# Patient Record
Sex: Female | Born: 1935 | Race: White | Hispanic: No | Marital: Married
Health system: Southern US, Community
[De-identification: ages and names within clinical notes are randomized; demographics above are authoritative.]

---

## 2005-01-02 ENCOUNTER — Ambulatory Visit: Payer: Self-pay | Admitting: Otolaryngology

## 2005-04-14 ENCOUNTER — Ambulatory Visit: Payer: Self-pay | Admitting: Internal Medicine

## 2005-09-29 ENCOUNTER — Ambulatory Visit: Payer: Self-pay | Admitting: General Practice

## 2005-11-12 ENCOUNTER — Ambulatory Visit: Payer: Self-pay | Admitting: Unknown Physician Specialty

## 2006-04-16 ENCOUNTER — Ambulatory Visit: Payer: Self-pay | Admitting: Internal Medicine

## 2007-04-21 ENCOUNTER — Ambulatory Visit: Payer: Self-pay | Admitting: Internal Medicine

## 2008-07-19 ENCOUNTER — Ambulatory Visit: Payer: Self-pay | Admitting: Family Medicine

## 2008-07-19 ENCOUNTER — Ambulatory Visit: Payer: Self-pay | Admitting: Internal Medicine

## 2008-08-21 ENCOUNTER — Ambulatory Visit: Payer: Self-pay

## 2008-08-30 ENCOUNTER — Ambulatory Visit: Payer: Self-pay

## 2008-10-26 ENCOUNTER — Emergency Department: Payer: Self-pay | Admitting: Emergency Medicine

## 2009-06-27 ENCOUNTER — Ambulatory Visit: Payer: Self-pay | Admitting: Otolaryngology

## 2009-10-19 ENCOUNTER — Ambulatory Visit: Payer: Self-pay | Admitting: Internal Medicine

## 2010-10-02 ENCOUNTER — Ambulatory Visit: Payer: Medicare Other | Admitting: Occupational Therapy

## 2010-10-02 ENCOUNTER — Ambulatory Visit: Payer: Medicare Other | Attending: Internal Medicine | Admitting: Physical Therapy

## 2010-10-02 DIAGNOSIS — M2420 Disorder of ligament, unspecified site: Secondary | ICD-10-CM | POA: Insufficient documentation

## 2010-10-02 DIAGNOSIS — R269 Unspecified abnormalities of gait and mobility: Secondary | ICD-10-CM | POA: Insufficient documentation

## 2010-10-02 DIAGNOSIS — Z5189 Encounter for other specified aftercare: Secondary | ICD-10-CM | POA: Insufficient documentation

## 2010-10-02 DIAGNOSIS — M6281 Muscle weakness (generalized): Secondary | ICD-10-CM | POA: Insufficient documentation

## 2010-10-02 DIAGNOSIS — I69998 Other sequelae following unspecified cerebrovascular disease: Secondary | ICD-10-CM | POA: Insufficient documentation

## 2010-10-02 DIAGNOSIS — M2560 Stiffness of unspecified joint, not elsewhere classified: Secondary | ICD-10-CM | POA: Insufficient documentation

## 2010-10-02 DIAGNOSIS — M629 Disorder of muscle, unspecified: Secondary | ICD-10-CM | POA: Insufficient documentation

## 2010-10-04 ENCOUNTER — Ambulatory Visit: Payer: Medicare Other | Admitting: Physical Therapy

## 2010-10-09 ENCOUNTER — Ambulatory Visit: Payer: Medicare Other | Admitting: Physical Therapy

## 2010-10-10 ENCOUNTER — Ambulatory Visit: Payer: Medicare Other | Admitting: Physical Therapy

## 2010-10-11 ENCOUNTER — Ambulatory Visit: Payer: Medicare Other | Admitting: Physical Therapy

## 2010-10-14 ENCOUNTER — Ambulatory Visit: Payer: Medicare Other | Admitting: Physical Therapy

## 2010-10-14 ENCOUNTER — Ambulatory Visit: Payer: Medicare Other | Admitting: Occupational Therapy

## 2010-10-16 ENCOUNTER — Ambulatory Visit: Payer: Medicare Other | Admitting: Physical Therapy

## 2010-10-16 ENCOUNTER — Ambulatory Visit: Payer: Medicare Other | Admitting: Occupational Therapy

## 2010-10-18 ENCOUNTER — Ambulatory Visit: Payer: Medicare Other | Admitting: Physical Therapy

## 2010-10-18 ENCOUNTER — Ambulatory Visit: Payer: Medicare Other | Admitting: Occupational Therapy

## 2010-10-21 ENCOUNTER — Ambulatory Visit: Payer: Medicare Other | Admitting: Physical Therapy

## 2010-10-21 ENCOUNTER — Ambulatory Visit: Payer: Medicare Other | Admitting: Occupational Therapy

## 2010-10-22 ENCOUNTER — Ambulatory Visit: Payer: Medicare Other | Admitting: Occupational Therapy

## 2010-10-22 ENCOUNTER — Ambulatory Visit: Payer: Medicare Other | Admitting: Physical Therapy

## 2010-10-24 ENCOUNTER — Ambulatory Visit: Payer: Medicare Other | Admitting: Physical Therapy

## 2010-10-24 ENCOUNTER — Ambulatory Visit: Payer: Medicare Other | Admitting: Occupational Therapy

## 2010-10-29 ENCOUNTER — Ambulatory Visit: Payer: Medicare Other | Attending: Internal Medicine | Admitting: Physical Therapy

## 2010-10-29 DIAGNOSIS — R269 Unspecified abnormalities of gait and mobility: Secondary | ICD-10-CM | POA: Insufficient documentation

## 2010-10-29 DIAGNOSIS — M629 Disorder of muscle, unspecified: Secondary | ICD-10-CM | POA: Insufficient documentation

## 2010-10-29 DIAGNOSIS — I69998 Other sequelae following unspecified cerebrovascular disease: Secondary | ICD-10-CM | POA: Insufficient documentation

## 2010-10-29 DIAGNOSIS — M2560 Stiffness of unspecified joint, not elsewhere classified: Secondary | ICD-10-CM | POA: Insufficient documentation

## 2010-10-29 DIAGNOSIS — M2420 Disorder of ligament, unspecified site: Secondary | ICD-10-CM | POA: Insufficient documentation

## 2010-10-29 DIAGNOSIS — M6281 Muscle weakness (generalized): Secondary | ICD-10-CM | POA: Insufficient documentation

## 2010-10-29 DIAGNOSIS — Z5189 Encounter for other specified aftercare: Secondary | ICD-10-CM | POA: Insufficient documentation

## 2010-10-30 ENCOUNTER — Ambulatory Visit: Payer: Medicare Other | Admitting: Occupational Therapy

## 2010-10-30 ENCOUNTER — Ambulatory Visit: Payer: Medicare Other | Admitting: Physical Therapy

## 2010-11-01 ENCOUNTER — Ambulatory Visit: Payer: Medicare Other | Admitting: Physical Therapy

## 2010-11-01 ENCOUNTER — Ambulatory Visit: Payer: Medicare Other | Admitting: Occupational Therapy

## 2010-11-04 ENCOUNTER — Ambulatory Visit: Payer: Medicare Other | Admitting: Occupational Therapy

## 2010-11-04 ENCOUNTER — Ambulatory Visit: Payer: Medicare Other | Admitting: Physical Therapy

## 2010-11-07 ENCOUNTER — Ambulatory Visit: Payer: Medicare Other | Admitting: Physical Therapy

## 2010-11-07 ENCOUNTER — Ambulatory Visit: Payer: Medicare Other | Admitting: Occupational Therapy

## 2010-11-11 ENCOUNTER — Ambulatory Visit: Payer: Medicare Other | Admitting: Occupational Therapy

## 2010-11-11 ENCOUNTER — Ambulatory Visit: Payer: Medicare Other | Admitting: Physical Therapy

## 2010-11-14 ENCOUNTER — Ambulatory Visit: Payer: Medicare Other | Admitting: Occupational Therapy

## 2010-11-14 ENCOUNTER — Ambulatory Visit: Payer: Medicare Other | Admitting: Physical Therapy

## 2010-11-18 ENCOUNTER — Ambulatory Visit: Payer: Medicare Other | Admitting: Physical Therapy

## 2010-11-18 ENCOUNTER — Ambulatory Visit: Payer: Medicare Other | Admitting: Occupational Therapy

## 2010-11-21 ENCOUNTER — Ambulatory Visit: Payer: Medicare Other | Admitting: Occupational Therapy

## 2010-11-21 ENCOUNTER — Ambulatory Visit: Payer: Medicare Other | Admitting: Physical Therapy

## 2010-11-25 ENCOUNTER — Ambulatory Visit: Payer: Medicare Other | Attending: Internal Medicine | Admitting: Physical Therapy

## 2010-11-25 ENCOUNTER — Ambulatory Visit: Payer: Medicare Other | Admitting: Occupational Therapy

## 2010-11-25 DIAGNOSIS — I69998 Other sequelae following unspecified cerebrovascular disease: Secondary | ICD-10-CM | POA: Insufficient documentation

## 2010-11-25 DIAGNOSIS — M2420 Disorder of ligament, unspecified site: Secondary | ICD-10-CM | POA: Insufficient documentation

## 2010-11-25 DIAGNOSIS — M629 Disorder of muscle, unspecified: Secondary | ICD-10-CM | POA: Insufficient documentation

## 2010-11-25 DIAGNOSIS — M6281 Muscle weakness (generalized): Secondary | ICD-10-CM | POA: Insufficient documentation

## 2010-11-25 DIAGNOSIS — R269 Unspecified abnormalities of gait and mobility: Secondary | ICD-10-CM | POA: Insufficient documentation

## 2010-11-25 DIAGNOSIS — M2560 Stiffness of unspecified joint, not elsewhere classified: Secondary | ICD-10-CM | POA: Insufficient documentation

## 2010-11-25 DIAGNOSIS — Z5189 Encounter for other specified aftercare: Secondary | ICD-10-CM | POA: Insufficient documentation

## 2010-11-28 ENCOUNTER — Ambulatory Visit: Payer: Medicare Other | Admitting: Occupational Therapy

## 2010-11-28 ENCOUNTER — Ambulatory Visit: Payer: Medicare Other | Admitting: Physical Therapy

## 2010-12-03 ENCOUNTER — Ambulatory Visit: Payer: Medicare Other | Admitting: Occupational Therapy

## 2010-12-03 ENCOUNTER — Ambulatory Visit: Payer: Medicare Other | Admitting: Physical Therapy

## 2010-12-06 ENCOUNTER — Ambulatory Visit: Payer: Medicare Other | Admitting: Physical Therapy

## 2010-12-10 ENCOUNTER — Ambulatory Visit: Payer: Medicare Other | Admitting: Physical Therapy

## 2010-12-10 ENCOUNTER — Ambulatory Visit: Payer: Medicare Other | Admitting: Occupational Therapy

## 2010-12-12 ENCOUNTER — Ambulatory Visit: Payer: Medicare Other | Admitting: Physical Therapy

## 2010-12-12 ENCOUNTER — Ambulatory Visit: Payer: Medicare Other | Admitting: Occupational Therapy

## 2010-12-17 ENCOUNTER — Ambulatory Visit: Payer: Medicare Other | Admitting: Physical Therapy

## 2010-12-17 ENCOUNTER — Ambulatory Visit: Payer: Medicare Other | Admitting: Occupational Therapy

## 2010-12-19 ENCOUNTER — Ambulatory Visit: Payer: Medicare Other | Admitting: Physical Therapy

## 2010-12-19 ENCOUNTER — Ambulatory Visit: Payer: Medicare Other | Admitting: Occupational Therapy

## 2010-12-24 ENCOUNTER — Ambulatory Visit: Payer: Medicare Other | Admitting: Physical Therapy

## 2010-12-24 ENCOUNTER — Ambulatory Visit: Payer: Medicare Other | Admitting: Occupational Therapy

## 2010-12-26 ENCOUNTER — Ambulatory Visit: Payer: Medicare Other | Attending: Internal Medicine | Admitting: Physical Therapy

## 2010-12-26 ENCOUNTER — Ambulatory Visit: Payer: Medicare Other | Admitting: Occupational Therapy

## 2010-12-26 DIAGNOSIS — M6281 Muscle weakness (generalized): Secondary | ICD-10-CM | POA: Insufficient documentation

## 2010-12-26 DIAGNOSIS — Z5189 Encounter for other specified aftercare: Secondary | ICD-10-CM | POA: Insufficient documentation

## 2010-12-26 DIAGNOSIS — M2420 Disorder of ligament, unspecified site: Secondary | ICD-10-CM | POA: Insufficient documentation

## 2010-12-26 DIAGNOSIS — M629 Disorder of muscle, unspecified: Secondary | ICD-10-CM | POA: Insufficient documentation

## 2010-12-26 DIAGNOSIS — I69998 Other sequelae following unspecified cerebrovascular disease: Secondary | ICD-10-CM | POA: Insufficient documentation

## 2010-12-26 DIAGNOSIS — R269 Unspecified abnormalities of gait and mobility: Secondary | ICD-10-CM | POA: Insufficient documentation

## 2010-12-26 DIAGNOSIS — M2560 Stiffness of unspecified joint, not elsewhere classified: Secondary | ICD-10-CM | POA: Insufficient documentation

## 2010-12-30 ENCOUNTER — Ambulatory Visit: Payer: Medicare Other | Admitting: Physical Therapy

## 2011-01-02 ENCOUNTER — Ambulatory Visit: Payer: Medicare Other | Admitting: Physical Therapy

## 2011-01-07 ENCOUNTER — Ambulatory Visit: Payer: Medicare Other | Admitting: Physical Therapy

## 2011-01-09 ENCOUNTER — Ambulatory Visit: Payer: Medicare Other | Admitting: Physical Therapy

## 2011-01-13 ENCOUNTER — Ambulatory Visit: Payer: Medicare Other | Admitting: Physical Therapy

## 2011-01-14 ENCOUNTER — Ambulatory Visit: Payer: Medicare Other | Admitting: Physical Therapy

## 2011-01-20 ENCOUNTER — Ambulatory Visit: Payer: Medicare Other | Admitting: Physical Therapy

## 2011-01-23 ENCOUNTER — Ambulatory Visit: Payer: Medicare Other | Admitting: Physical Therapy

## 2011-01-27 ENCOUNTER — Ambulatory Visit: Payer: Medicare Other | Attending: Internal Medicine | Admitting: Physical Therapy

## 2011-01-27 ENCOUNTER — Ambulatory Visit: Payer: Medicare Other | Admitting: Physical Therapy

## 2011-01-27 DIAGNOSIS — M2560 Stiffness of unspecified joint, not elsewhere classified: Secondary | ICD-10-CM | POA: Insufficient documentation

## 2011-01-27 DIAGNOSIS — M2420 Disorder of ligament, unspecified site: Secondary | ICD-10-CM | POA: Insufficient documentation

## 2011-01-27 DIAGNOSIS — R269 Unspecified abnormalities of gait and mobility: Secondary | ICD-10-CM | POA: Insufficient documentation

## 2011-01-27 DIAGNOSIS — M629 Disorder of muscle, unspecified: Secondary | ICD-10-CM | POA: Insufficient documentation

## 2011-01-27 DIAGNOSIS — M6281 Muscle weakness (generalized): Secondary | ICD-10-CM | POA: Insufficient documentation

## 2011-01-27 DIAGNOSIS — Z5189 Encounter for other specified aftercare: Secondary | ICD-10-CM | POA: Insufficient documentation

## 2011-01-27 DIAGNOSIS — I69998 Other sequelae following unspecified cerebrovascular disease: Secondary | ICD-10-CM | POA: Insufficient documentation

## 2011-01-30 ENCOUNTER — Ambulatory Visit: Payer: Medicare Other | Admitting: Physical Therapy

## 2011-02-03 ENCOUNTER — Ambulatory Visit: Payer: Medicare Other | Admitting: Physical Therapy

## 2011-02-04 ENCOUNTER — Ambulatory Visit: Payer: Medicare Other | Admitting: Physical Therapy

## 2011-03-11 ENCOUNTER — Ambulatory Visit: Payer: Self-pay | Admitting: Internal Medicine

## 2011-03-11 ENCOUNTER — Ambulatory Visit: Payer: Self-pay

## 2012-01-08 ENCOUNTER — Ambulatory Visit: Payer: Self-pay | Admitting: Family Medicine

## 2012-01-15 ENCOUNTER — Ambulatory Visit: Payer: Self-pay | Admitting: Family Medicine

## 2012-01-30 ENCOUNTER — Ambulatory Visit: Payer: Self-pay | Admitting: Family Medicine

## 2012-06-08 ENCOUNTER — Emergency Department: Payer: Self-pay | Admitting: Emergency Medicine

## 2012-08-04 ENCOUNTER — Ambulatory Visit: Payer: Self-pay | Admitting: Ophthalmology

## 2012-08-25 ENCOUNTER — Ambulatory Visit: Payer: Self-pay | Admitting: Ophthalmology

## 2013-02-03 ENCOUNTER — Observation Stay: Payer: Self-pay | Admitting: Internal Medicine

## 2013-02-03 LAB — COMPREHENSIVE METABOLIC PANEL
Albumin: 3.5 g/dL (ref 3.4–5.0)
Anion Gap: 13 (ref 7–16)
BUN: 11 mg/dL (ref 7–18)
Bilirubin,Total: 0.8 mg/dL (ref 0.2–1.0)
Calcium, Total: 9.4 mg/dL (ref 8.5–10.1)
Chloride: 110 mmol/L — ABNORMAL HIGH (ref 98–107)
Creatinine: 0.58 mg/dL — ABNORMAL LOW (ref 0.60–1.30)
EGFR (African American): >60
Potassium: 4.3 mmol/L (ref 3.5–5.1)
Sodium: 138 mmol/L (ref 136–145)
Total Protein: 6.9 g/dL (ref 6.4–8.2)

## 2013-02-03 LAB — TROPONIN I: Troponin-I: <0.02 ng/mL

## 2013-02-03 LAB — CBC WITH DIFFERENTIAL/PLATELET
Eosinophil %: 0.1 %
HCT: 51.3 % — ABNORMAL HIGH (ref 35.0–47.0)
MCH: 29.7 pg (ref 26.0–34.0)
MCHC: 32.4 g/dL (ref 32.0–36.0)
MCV: 92 fL (ref 80–100)
Monocyte #: 1 x10 3/mm — ABNORMAL HIGH (ref 0.2–0.9)
Monocyte %: 4.4 %
Neutrophil %: 87.8 %
Platelet: 232 10*3/uL (ref 150–440)

## 2013-02-03 LAB — URINALYSIS, COMPLETE
Blood: NEGATIVE
Ketone: NEGATIVE
Nitrite: NEGATIVE
RBC,UR: 1 /HPF (ref 0–5)
Specific Gravity: 1.023 (ref 1.003–1.030)
Squamous Epithelial: 5
WBC UR: 23 /HPF (ref 0–5)

## 2013-02-03 LAB — LIPASE, BLOOD: Lipase: 113 U/L (ref 73–393)

## 2013-02-04 LAB — CBC WITH DIFFERENTIAL/PLATELET
Basophil #: 0.1 10*3/uL (ref 0.0–0.1)
Basophil %: 0.9 %
Eosinophil #: 0 10*3/uL (ref 0.0–0.7)
Eosinophil %: 0.2 %
Lymphocyte #: 1.5 10*3/uL (ref 1.0–3.6)
Lymphocyte %: 11.2 %
MCHC: 33.5 g/dL (ref 32.0–36.0)
Neutrophil %: 83.8 %
RDW: 14 % (ref 11.5–14.5)
WBC: 13.7 10*3/uL — ABNORMAL HIGH (ref 3.6–11.0)

## 2013-02-04 LAB — BASIC METABOLIC PANEL
Anion Gap: 7 (ref 7–16)
Calcium, Total: 8.7 mg/dL (ref 8.5–10.1)
Chloride: 109 mmol/L — ABNORMAL HIGH (ref 98–107)
Creatinine: 0.69 mg/dL (ref 0.60–1.30)
EGFR (Non-African Amer.): >60
Glucose: 152 mg/dL — ABNORMAL HIGH (ref 65–99)
Osmolality: 283 (ref 275–301)
Potassium: 4.1 mmol/L (ref 3.5–5.1)

## 2013-02-04 LAB — TSH: Thyroid Stimulating Horm: 0.65 u[IU]/mL

## 2013-02-04 LAB — PROTIME-INR: Prothrombin Time: 31.7 secs — ABNORMAL HIGH (ref 11.5–14.7)

## 2013-02-04 LAB — MAGNESIUM: Magnesium: 1.6 mg/dL — ABNORMAL LOW

## 2013-02-04 LAB — HEMOGLOBIN A1C: Hemoglobin A1C: 6.7 % — ABNORMAL HIGH (ref 4.2–6.3)

## 2013-02-06 LAB — URINE CULTURE

## 2013-02-06 LAB — STOOL CULTURE

## 2013-06-19 ENCOUNTER — Emergency Department: Payer: Self-pay | Admitting: Emergency Medicine

## 2013-06-19 LAB — CBC WITH DIFFERENTIAL/PLATELET
BASOS ABS: 0.1 10*3/uL (ref 0.0–0.1)
Basophil %: 1.2 %
Eosinophil #: 0.2 10*3/uL (ref 0.0–0.7)
Eosinophil %: 3 %
HCT: 43.7 % (ref 35.0–47.0)
HGB: 14.4 g/dL (ref 12.0–16.0)
LYMPHS PCT: 18.5 %
Lymphocyte #: 1.4 10*3/uL (ref 1.0–3.6)
MCH: 30.4 pg (ref 26.0–34.0)
MCHC: 33 g/dL (ref 32.0–36.0)
MCV: 92 fL (ref 80–100)
MONO ABS: 0.5 x10 3/mm (ref 0.2–0.9)
Monocyte %: 6 %
NEUTROS ABS: 5.4 10*3/uL (ref 1.4–6.5)
NEUTROS PCT: 71.3 %
Platelet: 180 10*3/uL (ref 150–440)
RBC: 4.76 10*6/uL (ref 3.80–5.20)
RDW: 14.2 % (ref 11.5–14.5)
WBC: 7.6 10*3/uL (ref 3.6–11.0)

## 2013-06-19 LAB — BASIC METABOLIC PANEL
ANION GAP: 6 — AB (ref 7–16)
BUN: 10 mg/dL (ref 7–18)
CALCIUM: 8.6 mg/dL (ref 8.5–10.1)
Chloride: 106 mmol/L (ref 98–107)
Co2: 30 mmol/L (ref 21–32)
Creatinine: 0.61 mg/dL (ref 0.60–1.30)
EGFR (African American): >60
Glucose: 127 mg/dL — ABNORMAL HIGH (ref 65–99)
OSMOLALITY: 284 (ref 275–301)
Potassium: 3.8 mmol/L (ref 3.5–5.1)
SODIUM: 142 mmol/L (ref 136–145)

## 2013-06-19 LAB — PROTIME-INR
INR: 2.1
Prothrombin Time: 22.9 secs — ABNORMAL HIGH (ref 11.5–14.7)

## 2013-06-19 LAB — URINALYSIS, COMPLETE
BILIRUBIN, UR: NEGATIVE
Bacteria: NONE SEEN
GLUCOSE, UR: NEGATIVE mg/dL (ref 0–75)
Ketone: NEGATIVE
Leukocyte Esterase: NEGATIVE
NITRITE: NEGATIVE
Ph: 5 (ref 4.5–8.0)
Protein: NEGATIVE
RBC,UR: 63 /HPF (ref 0–5)
SQUAMOUS EPITHELIAL: NONE SEEN
Specific Gravity: 1.012 (ref 1.003–1.030)
WBC UR: <1 /HPF (ref 0–5)

## 2013-06-19 LAB — TROPONIN I

## 2013-07-04 ENCOUNTER — Ambulatory Visit: Payer: Self-pay | Admitting: Family Medicine

## 2013-07-05 ENCOUNTER — Ambulatory Visit: Payer: Self-pay | Admitting: Unknown Physician Specialty

## 2013-07-05 ENCOUNTER — Inpatient Hospital Stay: Payer: Self-pay | Admitting: Internal Medicine

## 2013-07-05 LAB — CBC WITH DIFFERENTIAL/PLATELET
BASOS ABS: 0.3 10*3/uL — AB (ref 0.0–0.1)
Basophil %: 1.4 %
EOS ABS: 0 10*3/uL (ref 0.0–0.7)
EOS PCT: 0.2 %
HCT: 47.6 % — AB (ref 35.0–47.0)
HGB: 15.7 g/dL (ref 12.0–16.0)
LYMPHS ABS: 0.7 10*3/uL — AB (ref 1.0–3.6)
LYMPHS PCT: 3.8 %
MCH: 30 pg (ref 26.0–34.0)
MCHC: 33 g/dL (ref 32.0–36.0)
MCV: 91 fL (ref 80–100)
MONO ABS: 0.9 x10 3/mm (ref 0.2–0.9)
Monocyte %: 4.7 %
NEUTROS ABS: 17.4 10*3/uL — AB (ref 1.4–6.5)
NEUTROS PCT: 89.9 %
Platelet: 168 10*3/uL (ref 150–440)
RBC: 5.24 10*6/uL — ABNORMAL HIGH (ref 3.80–5.20)
RDW: 13.9 % (ref 11.5–14.5)
WBC: 19.4 10*3/uL — ABNORMAL HIGH (ref 3.6–11.0)

## 2013-07-05 LAB — COMPREHENSIVE METABOLIC PANEL
ALK PHOS: 96 U/L
ANION GAP: 9 (ref 7–16)
Albumin: 3.5 g/dL (ref 3.4–5.0)
BUN: 8 mg/dL (ref 7–18)
Bilirubin,Total: 1.2 mg/dL — ABNORMAL HIGH (ref 0.2–1.0)
CHLORIDE: 109 mmol/L — AB (ref 98–107)
CO2: 24 mmol/L (ref 21–32)
Calcium, Total: 8.8 mg/dL (ref 8.5–10.1)
Creatinine: 0.71 mg/dL (ref 0.60–1.30)
EGFR (Non-African Amer.): >60
Glucose: 194 mg/dL — ABNORMAL HIGH (ref 65–99)
Osmolality: 287 (ref 275–301)
Potassium: 3.6 mmol/L (ref 3.5–5.1)
SGOT(AST): 32 U/L (ref 15–37)
SGPT (ALT): 27 U/L (ref 12–78)
Sodium: 142 mmol/L (ref 136–145)
Total Protein: 6.7 g/dL (ref 6.4–8.2)

## 2013-07-05 LAB — URINALYSIS, COMPLETE
Bilirubin,UR: NEGATIVE
Glucose,UR: NEGATIVE mg/dL (ref 0–75)
LEUKOCYTE ESTERASE: NEGATIVE
Nitrite: NEGATIVE
PH: 5 (ref 4.5–8.0)
Protein: NEGATIVE
SQUAMOUS EPITHELIAL: NONE SEEN
Specific Gravity: 1.011 (ref 1.003–1.030)

## 2013-07-05 LAB — PROTIME-INR
INR: 2.4
PROTHROMBIN TIME: 25.3 s — AB (ref 11.5–14.7)

## 2013-07-06 LAB — CBC WITH DIFFERENTIAL/PLATELET
BASOS PCT: 0.3 %
Basophil #: 0.1 10*3/uL (ref 0.0–0.1)
EOS ABS: 0.3 10*3/uL (ref 0.0–0.7)
Eosinophil %: 1.6 %
HCT: 44.9 % (ref 35.0–47.0)
HGB: 14.5 g/dL (ref 12.0–16.0)
Lymphocyte #: 0.6 10*3/uL — ABNORMAL LOW (ref 1.0–3.6)
Lymphocyte %: 3.7 %
MCH: 29.3 pg (ref 26.0–34.0)
MCHC: 32.4 g/dL (ref 32.0–36.0)
MCV: 91 fL (ref 80–100)
MONOS PCT: 4.9 %
Monocyte #: 0.9 x10 3/mm (ref 0.2–0.9)
NEUTROS ABS: 15.6 10*3/uL — AB (ref 1.4–6.5)
Neutrophil %: 89.5 %
Platelet: 145 10*3/uL — ABNORMAL LOW (ref 150–440)
RBC: 4.96 10*6/uL (ref 3.80–5.20)
RDW: 14.2 % (ref 11.5–14.5)
WBC: 17.4 10*3/uL — AB (ref 3.6–11.0)

## 2013-07-06 LAB — BASIC METABOLIC PANEL
ANION GAP: 6 — AB (ref 7–16)
BUN: 7 mg/dL (ref 7–18)
CHLORIDE: 106 mmol/L (ref 98–107)
CO2: 28 mmol/L (ref 21–32)
Calcium, Total: 8.7 mg/dL (ref 8.5–10.1)
Creatinine: 0.65 mg/dL (ref 0.60–1.30)
EGFR (African American): >60
GLUCOSE: 181 mg/dL — AB (ref 65–99)
Osmolality: 282 (ref 275–301)
Potassium: 4.1 mmol/L (ref 3.5–5.1)
SODIUM: 140 mmol/L (ref 136–145)

## 2013-07-06 LAB — PROTIME-INR
INR: 1.5
Prothrombin Time: 17.7 secs — ABNORMAL HIGH (ref 11.5–14.7)

## 2013-07-06 LAB — MAGNESIUM: MAGNESIUM: 1.7 mg/dL — AB

## 2013-07-07 LAB — PROTIME-INR
INR: 1.2
PROTHROMBIN TIME: 14.6 s (ref 11.5–14.7)

## 2013-07-08 LAB — HEMOGLOBIN: HGB: 11.2 g/dL — AB (ref 12.0–16.0)

## 2013-07-08 LAB — PROTIME-INR
INR: 1.3
PROTHROMBIN TIME: 15.8 s — AB (ref 11.5–14.7)

## 2013-07-08 LAB — BASIC METABOLIC PANEL
Anion Gap: 6 — ABNORMAL LOW (ref 7–16)
BUN: 7 mg/dL (ref 7–18)
Calcium, Total: 7.6 mg/dL — ABNORMAL LOW (ref 8.5–10.1)
Chloride: 107 mmol/L (ref 98–107)
Co2: 28 mmol/L (ref 21–32)
Creatinine: 0.82 mg/dL (ref 0.60–1.30)
EGFR (Non-African Amer.): >60
GLUCOSE: 173 mg/dL — AB (ref 65–99)
Osmolality: 283 (ref 275–301)
POTASSIUM: 3.7 mmol/L (ref 3.5–5.1)
SODIUM: 141 mmol/L (ref 136–145)

## 2013-07-08 LAB — PLATELET COUNT: Platelet: 96 10*3/uL — ABNORMAL LOW (ref 150–440)

## 2013-07-09 LAB — PROTIME-INR
INR: 2.4
Prothrombin Time: 25.6 secs — ABNORMAL HIGH (ref 11.5–14.7)

## 2013-07-09 LAB — CBC
HCT: 33.1 % — ABNORMAL LOW (ref 35.0–47.0)
HGB: 10.9 g/dL — ABNORMAL LOW (ref 12.0–16.0)
MCH: 29.6 pg (ref 26.0–34.0)
MCHC: 32.7 g/dL (ref 32.0–36.0)
MCV: 90 fL (ref 80–100)
Platelet: 107 10*3/uL — ABNORMAL LOW (ref 150–440)
RBC: 3.67 10*6/uL — AB (ref 3.80–5.20)
RDW: 14.1 % (ref 11.5–14.5)
WBC: 11 10*3/uL (ref 3.6–11.0)

## 2013-07-10 LAB — PROTIME-INR
INR: 2.4
PROTHROMBIN TIME: 25.6 s — AB (ref 11.5–14.7)

## 2013-07-11 LAB — PROTIME-INR
INR: 3.8
Prothrombin Time: 35.9 secs — ABNORMAL HIGH (ref 11.5–14.7)

## 2013-07-12 LAB — PATHOLOGY REPORT

## 2013-07-22 ENCOUNTER — Other Ambulatory Visit: Payer: Self-pay | Admitting: Family Medicine

## 2013-07-22 LAB — COMPREHENSIVE METABOLIC PANEL
ALT: 16 U/L (ref 12–78)
Albumin: 3.6 g/dL (ref 3.4–5.0)
Alkaline Phosphatase: 172 U/L — ABNORMAL HIGH
Anion Gap: 9 (ref 7–16)
BILIRUBIN TOTAL: 1 mg/dL (ref 0.2–1.0)
BUN: 6 mg/dL — ABNORMAL LOW (ref 7–18)
CREATININE: 0.69 mg/dL (ref 0.60–1.30)
Calcium, Total: 9.3 mg/dL (ref 8.5–10.1)
Chloride: 104 mmol/L (ref 98–107)
Co2: 27 mmol/L (ref 21–32)
EGFR (Non-African Amer.): >60
GLUCOSE: 143 mg/dL — AB (ref 65–99)
Osmolality: 279 (ref 275–301)
Potassium: 4.2 mmol/L (ref 3.5–5.1)
SGOT(AST): 21 U/L (ref 15–37)
Sodium: 140 mmol/L (ref 136–145)
Total Protein: 6.8 g/dL (ref 6.4–8.2)

## 2013-07-22 LAB — CBC WITH DIFFERENTIAL/PLATELET
Basophil #: 0.1 10*3/uL (ref 0.0–0.1)
Basophil %: 0.6 %
Eosinophil #: 0.1 10*3/uL (ref 0.0–0.7)
Eosinophil %: 0.9 %
HCT: 41.9 % (ref 35.0–47.0)
HGB: 13.6 g/dL (ref 12.0–16.0)
Lymphocyte #: 1.6 10*3/uL (ref 1.0–3.6)
Lymphocyte %: 12.5 %
MCH: 29.2 pg (ref 26.0–34.0)
MCHC: 32.4 g/dL (ref 32.0–36.0)
MCV: 90 fL (ref 80–100)
Monocyte #: 0.7 x10 3/mm (ref 0.2–0.9)
Monocyte %: 5 %
NEUTROS ABS: 10.5 10*3/uL — AB (ref 1.4–6.5)
Neutrophil %: 81 %
PLATELETS: 464 10*3/uL — AB (ref 150–440)
RBC: 4.65 10*6/uL (ref 3.80–5.20)
RDW: 14.6 % — ABNORMAL HIGH (ref 11.5–14.5)
WBC: 13 10*3/uL — ABNORMAL HIGH (ref 3.6–11.0)

## 2013-07-22 LAB — PROTIME-INR
INR: 1.3
Prothrombin Time: 16 secs — ABNORMAL HIGH (ref 11.5–14.7)

## 2013-09-07 ENCOUNTER — Emergency Department: Payer: Self-pay | Admitting: Emergency Medicine

## 2013-10-17 ENCOUNTER — Emergency Department: Payer: Self-pay | Admitting: Emergency Medicine

## 2013-10-26 ENCOUNTER — Emergency Department: Payer: Self-pay | Admitting: Emergency Medicine

## 2013-11-05 ENCOUNTER — Emergency Department: Payer: Self-pay | Admitting: Internal Medicine

## 2013-11-05 LAB — COMPREHENSIVE METABOLIC PANEL
ANION GAP: 5 — AB (ref 7–16)
Albumin: 3.2 g/dL — ABNORMAL LOW (ref 3.4–5.0)
Alkaline Phosphatase: 115 U/L
BUN: 7 mg/dL (ref 7–18)
Bilirubin,Total: 0.4 mg/dL (ref 0.2–1.0)
CALCIUM: 8.9 mg/dL (ref 8.5–10.1)
Chloride: 107 mmol/L (ref 98–107)
Co2: 30 mmol/L (ref 21–32)
Creatinine: 0.75 mg/dL (ref 0.60–1.30)
EGFR (African American): >60
EGFR (Non-African Amer.): >60
Glucose: 213 mg/dL — ABNORMAL HIGH (ref 65–99)
Osmolality: 287 (ref 275–301)
POTASSIUM: 4 mmol/L (ref 3.5–5.1)
SGOT(AST): 9 U/L — ABNORMAL LOW (ref 15–37)
SGPT (ALT): 11 U/L — ABNORMAL LOW
Sodium: 142 mmol/L (ref 136–145)
Total Protein: 6.4 g/dL (ref 6.4–8.2)

## 2013-11-05 LAB — CBC WITH DIFFERENTIAL/PLATELET
Basophil #: 0.1 10*3/uL (ref 0.0–0.1)
Basophil %: 0.8 %
EOS PCT: 2 %
Eosinophil #: 0.2 10*3/uL (ref 0.0–0.7)
HCT: 40.7 % (ref 35.0–47.0)
HGB: 12.9 g/dL (ref 12.0–16.0)
Lymphocyte #: 1.7 10*3/uL (ref 1.0–3.6)
Lymphocyte %: 18.4 %
MCH: 28.3 pg (ref 26.0–34.0)
MCHC: 31.6 g/dL — ABNORMAL LOW (ref 32.0–36.0)
MCV: 89 fL (ref 80–100)
Monocyte #: 0.6 x10 3/mm (ref 0.2–0.9)
Monocyte %: 6.7 %
NEUTROS ABS: 6.5 10*3/uL (ref 1.4–6.5)
Neutrophil %: 72.1 %
Platelet: 206 10*3/uL (ref 150–440)
RBC: 4.56 10*6/uL (ref 3.80–5.20)
RDW: 16.1 % — AB (ref 11.5–14.5)
WBC: 9 10*3/uL (ref 3.6–11.0)

## 2013-11-05 LAB — LIPASE, BLOOD: Lipase: 118 U/L (ref 73–393)

## 2013-11-07 ENCOUNTER — Emergency Department: Payer: Self-pay | Admitting: Emergency Medicine

## 2013-11-07 LAB — COMPREHENSIVE METABOLIC PANEL
ALK PHOS: 121 U/L — AB
ALT: 13 U/L — AB
ANION GAP: 8 (ref 7–16)
Albumin: 3.3 g/dL — ABNORMAL LOW (ref 3.4–5.0)
BUN: 8 mg/dL (ref 7–18)
Bilirubin,Total: 0.3 mg/dL (ref 0.2–1.0)
CALCIUM: 8.8 mg/dL (ref 8.5–10.1)
CHLORIDE: 107 mmol/L (ref 98–107)
CO2: 27 mmol/L (ref 21–32)
CREATININE: 0.74 mg/dL (ref 0.60–1.30)
Glucose: 191 mg/dL — ABNORMAL HIGH (ref 65–99)
OSMOLALITY: 287 (ref 275–301)
POTASSIUM: 3.9 mmol/L (ref 3.5–5.1)
SGOT(AST): 18 U/L (ref 15–37)
Sodium: 142 mmol/L (ref 136–145)
TOTAL PROTEIN: 6.9 g/dL (ref 6.4–8.2)

## 2013-11-07 LAB — CBC
HCT: 42.2 % (ref 35.0–47.0)
HGB: 13.3 g/dL (ref 12.0–16.0)
MCH: 28.4 pg (ref 26.0–34.0)
MCHC: 31.6 g/dL — AB (ref 32.0–36.0)
MCV: 90 fL (ref 80–100)
Platelet: 214 10*3/uL (ref 150–440)
RBC: 4.69 10*6/uL (ref 3.80–5.20)
RDW: 16.2 % — AB (ref 11.5–14.5)
WBC: 7.8 10*3/uL (ref 3.6–11.0)

## 2013-11-12 ENCOUNTER — Emergency Department: Payer: Self-pay | Admitting: Internal Medicine

## 2013-11-24 ENCOUNTER — Ambulatory Visit: Payer: Self-pay | Admitting: Internal Medicine

## 2013-11-25 ENCOUNTER — Emergency Department: Payer: Self-pay | Admitting: Emergency Medicine

## 2013-11-25 LAB — COMPREHENSIVE METABOLIC PANEL
ALBUMIN: 3.4 g/dL (ref 3.4–5.0)
ANION GAP: 6 — AB (ref 7–16)
Alkaline Phosphatase: 119 U/L — ABNORMAL HIGH
BILIRUBIN TOTAL: 0.5 mg/dL (ref 0.2–1.0)
BUN: 8 mg/dL (ref 7–18)
CREATININE: 0.83 mg/dL (ref 0.60–1.30)
Calcium, Total: 8.6 mg/dL (ref 8.5–10.1)
Chloride: 107 mmol/L (ref 98–107)
Co2: 29 mmol/L (ref 21–32)
EGFR (African American): >60
EGFR (Non-African Amer.): >60
Glucose: 231 mg/dL — ABNORMAL HIGH (ref 65–99)
OSMOLALITY: 289 (ref 275–301)
Potassium: 4.6 mmol/L (ref 3.5–5.1)
SGOT(AST): 26 U/L (ref 15–37)
SGPT (ALT): 16 U/L
Sodium: 142 mmol/L (ref 136–145)
Total Protein: 6.9 g/dL (ref 6.4–8.2)

## 2013-11-25 LAB — CBC WITH DIFFERENTIAL/PLATELET
BASOS ABS: 0.1 10*3/uL (ref 0.0–0.1)
Basophil %: 0.7 %
Eosinophil #: 0.1 10*3/uL (ref 0.0–0.7)
Eosinophil %: 0.6 %
HCT: 42 % (ref 35.0–47.0)
HGB: 13.3 g/dL (ref 12.0–16.0)
LYMPHS ABS: 0.9 10*3/uL — AB (ref 1.0–3.6)
Lymphocyte %: 4.6 %
MCH: 28.1 pg (ref 26.0–34.0)
MCHC: 31.7 g/dL — ABNORMAL LOW (ref 32.0–36.0)
MCV: 89 fL (ref 80–100)
Monocyte #: 0.8 x10 3/mm (ref 0.2–0.9)
Monocyte %: 4.4 %
NEUTROS ABS: 16.5 10*3/uL — AB (ref 1.4–6.5)
Neutrophil %: 89.7 %
Platelet: 216 10*3/uL (ref 150–440)
RBC: 4.74 10*6/uL (ref 3.80–5.20)
RDW: 15.8 % — ABNORMAL HIGH (ref 11.5–14.5)
WBC: 18.4 10*3/uL — AB (ref 3.6–11.0)

## 2013-11-25 LAB — APTT: ACTIVATED PTT: 29 s (ref 23.6–35.9)

## 2013-11-25 LAB — PROTIME-INR
INR: 1.6
Prothrombin Time: 18.8 secs — ABNORMAL HIGH (ref 11.5–14.7)

## 2013-12-18 ENCOUNTER — Inpatient Hospital Stay: Payer: Self-pay | Admitting: Vascular Surgery

## 2013-12-18 LAB — COMPREHENSIVE METABOLIC PANEL
ALBUMIN: 2.8 g/dL — AB (ref 3.4–5.0)
ALK PHOS: 158 U/L — AB
ALT: 33 U/L
ANION GAP: 10 (ref 7–16)
BUN: 13 mg/dL (ref 7–18)
Bilirubin,Total: 0.6 mg/dL (ref 0.2–1.0)
CALCIUM: 8.3 mg/dL — AB (ref 8.5–10.1)
CHLORIDE: 109 mmol/L — AB (ref 98–107)
CO2: 25 mmol/L (ref 21–32)
Creatinine: 0.92 mg/dL (ref 0.60–1.30)
EGFR (African American): >60
Glucose: 400 mg/dL — ABNORMAL HIGH (ref 65–99)
Osmolality: 304 (ref 275–301)
Potassium: 3.4 mmol/L — ABNORMAL LOW (ref 3.5–5.1)
SGOT(AST): 68 U/L — ABNORMAL HIGH (ref 15–37)
Sodium: 144 mmol/L (ref 136–145)
Total Protein: 6.3 g/dL — ABNORMAL LOW (ref 6.4–8.2)

## 2013-12-18 LAB — CBC
HCT: 40 % (ref 35.0–47.0)
HGB: 12.7 g/dL (ref 12.0–16.0)
MCH: 28.4 pg (ref 26.0–34.0)
MCHC: 31.6 g/dL — ABNORMAL LOW (ref 32.0–36.0)
MCV: 90 fL (ref 80–100)
Platelet: 161 10*3/uL (ref 150–440)
RBC: 4.46 10*6/uL (ref 3.80–5.20)
RDW: 15.5 % — ABNORMAL HIGH (ref 11.5–14.5)
WBC: 16.6 10*3/uL — AB (ref 3.6–11.0)

## 2013-12-18 LAB — URINALYSIS, COMPLETE
BILIRUBIN, UR: NEGATIVE
Bacteria: NONE SEEN
Blood: NEGATIVE
Hyaline Cast: 3
KETONE: NEGATIVE
LEUKOCYTE ESTERASE: NEGATIVE
Nitrite: NEGATIVE
Ph: 5 (ref 4.5–8.0)
Protein: NEGATIVE
RBC,UR: 1 /HPF (ref 0–5)
Specific Gravity: 1.026 (ref 1.003–1.030)
WBC UR: 3 /HPF (ref 0–5)

## 2013-12-18 LAB — PROTIME-INR
INR: 1.1
Prothrombin Time: 14.5 secs (ref 11.5–14.7)

## 2013-12-18 LAB — CK TOTAL AND CKMB (NOT AT ARMC)
CK, Total: 2348 U/L — ABNORMAL HIGH
CK-MB: 12.9 ng/mL — AB (ref 0.5–3.6)

## 2013-12-18 LAB — TROPONIN I: Troponin-I: 0.12 ng/mL — ABNORMAL HIGH

## 2013-12-19 LAB — BASIC METABOLIC PANEL
Anion Gap: 5 — ABNORMAL LOW (ref 7–16)
BUN: 10 mg/dL (ref 7–18)
Calcium, Total: 8.2 mg/dL — ABNORMAL LOW (ref 8.5–10.1)
Chloride: 112 mmol/L — ABNORMAL HIGH (ref 98–107)
Co2: 28 mmol/L (ref 21–32)
Creatinine: 0.7 mg/dL (ref 0.60–1.30)
EGFR (African American): >60
Glucose: 372 mg/dL — ABNORMAL HIGH (ref 65–99)
OSMOLALITY: 303 (ref 275–301)
Potassium: 4.8 mmol/L (ref 3.5–5.1)
SODIUM: 145 mmol/L (ref 136–145)

## 2013-12-19 LAB — CBC WITH DIFFERENTIAL/PLATELET
Basophil #: 0.1 10*3/uL (ref 0.0–0.1)
Basophil %: 0.6 %
EOS PCT: 0.1 %
Eosinophil #: 0 10*3/uL (ref 0.0–0.7)
HCT: 37.2 % (ref 35.0–47.0)
HGB: 11.5 g/dL — AB (ref 12.0–16.0)
LYMPHS ABS: 0.4 10*3/uL — AB (ref 1.0–3.6)
LYMPHS PCT: 2.2 %
MCH: 28.2 pg (ref 26.0–34.0)
MCHC: 30.8 g/dL — ABNORMAL LOW (ref 32.0–36.0)
MCV: 92 fL (ref 80–100)
Monocyte #: 0.3 x10 3/mm (ref 0.2–0.9)
Monocyte %: 1.6 %
NEUTROS ABS: 16.5 10*3/uL — AB (ref 1.4–6.5)
Neutrophil %: 95.5 %
Platelet: 135 10*3/uL — ABNORMAL LOW (ref 150–440)
RBC: 4.07 10*6/uL (ref 3.80–5.20)
RDW: 15.8 % — AB (ref 11.5–14.5)
WBC: 17.3 10*3/uL — AB (ref 3.6–11.0)

## 2013-12-19 LAB — APTT
ACTIVATED PTT: 36.1 s — AB (ref 23.6–35.9)
Activated PTT: 36.4 secs — ABNORMAL HIGH (ref 23.6–35.9)
Activated PTT: 29.7 secs (ref 23.6–35.9)

## 2013-12-19 LAB — TROPONIN I
TROPONIN-I: 0.09 ng/mL — AB
Troponin-I: 0.11 ng/mL — ABNORMAL HIGH
Troponin-I: 0.09 ng/mL — ABNORMAL HIGH

## 2013-12-25 ENCOUNTER — Ambulatory Visit: Payer: Self-pay | Admitting: Internal Medicine

## 2013-12-25 DEATH — deceased

## 2014-06-16 NOTE — H&P (Signed)
PATIENT NAME:  Jenna Austin, Jenna Austin MR#:  [id number] DATE OF BIRTH:  [birth year]  DATE OF ADMISSION:  02/03/2013  PRIMARY CARE PHYSICIAN:  Dr. Juanetta Gosling.   REFERRING PHYSICIAN:  Dr. Carollee Massed.   CHIEF COMPLAINT: Nausea, vomiting, diarrhea and a rash today.   HISTORY OF PRESENT ILLNESS: The patient is a 80 year old Caucasian female with a history of hypertension, diabetes, CVA, atrial fibrillation on Coumadin, presented to the ED with nausea, vomiting, diarrhea and rash today. The patient is alert, awake, oriented, in no acute distress. According to the patient and the patient's daughter, the patient started to have nausea, vomiting  multiple times this morning and then developed a rash all over the body. The patient feels itchy,  has a fever, but no chills. The patient also has diarrhea multiple times which is watery. Denies any melena or bloody stool. She denies any dysuria, hematuria, or incontinence. The patient denies any ill contacts, denies any new medication or special food or new kind of food. The patient's white count is a 22. Urinalysis showed urinary tract infection.   PAST MEDICAL HISTORY: Hypertension, diabetes, CVA, atrial fibrillation on Coumadin.   PAST SURGICAL HISTORY: Hysterectomy.   FAMILY HISTORY: Hypertension.   ALLERGIES: None.   HOME MEDICATIONS:  1.  Coumadin 3 mg p.o. at bedtime.  2.  Propranolol 40 mg p.o. daily.  3.  Metformin 500 mg p.o. b.i.d.  4.  Lisinopril 0.5 tablets p.o. daily.  5.  Atorvastatin 40 mg p.o. daily.   REVIEW OF SYSTEMS:  CONSTITUTIONAL: The patient has a fever, but no chills. No headache or dizziness but has weakness.  EYES: No double vision, blurred vision.  ENT: No postnasal drip, slurred speech or dysphagia.  CARDIOVASCULAR: No chest pain, palpitation, orthopnea, or nocturnal dyspnea and no leg edema.  PULMONARY:  No cough, sputum, or shortness of breath. No hemoptysis.  GASTROINTESTINAL: No abdominal pain, but had some nausea, vomiting,  diarrhea. No melena or bloody stool.  GENITOURINARY: No dysuria, hematuria, or incontinence.  SKIN: No rash or jaundice.  NEUROLOGY: No syncope, loss of consciousness or seizure.  HEMATOLOGY: No easy bruising, bleeding.  SKIN:  Rash all over the body. No jaundice.  NEUROLOGY: No syncope, loss of consciousness. No seizure.  HEMATOLOGY: No easy bruising or bleeding.  ENDOCRINE: No polyuria, polydipsia, heat or cold intolerance.   PHYSICAL EXAMINATION: VITAL SIGNS: Temperature is 98.4, blood pressure 121/87, pulse 102, respirations 20, oxygen saturation 96% on room air.  GENERAL: The patient is alert, awake, oriented, in no acute distress.  HEENT: Pupils round, equal and reactive to light and accommodation. Moist oral mucosa, clear oropharynx. Marland Kitchen  NECK: Supple. No JVD or carotid bruit. No lymphadenopathy. No thyromegaly.  CARDIOVASCULAR: S1, S2 regular rate and rhythm. No murmurs or gallops.  PULMONARY: Bilateral air entry. No wheezing or rales. No use of accessory muscle to breathe.  ABDOMEN: Soft. No distention. No tenderness. No organomegaly. Bowel sounds present.  EXTREMITIES: No edema, clubbing or cyanosis. No calf tenderness. Bilateral pedal pulses present.  NEUROLOGY: A and O x 3, left-sided weakness due to previous stroke. Left hand contracted. Power is 2/5 on the left side and 5/5 on the right side. Sensation intact.   LABORATORY, DIAGNOSTIC AND RADIOLOGIC DATA:Lactic acid 2.3. Urinalysis showed WBC 23 and nitrite negative.   Chest x-ray: No evidence of acute cardiopulmonary disease.   Glucose 186, BUN 11, creatinine 0.58, sodium 138, potassium 4.3, chloride 110, bicarbonate 15, calcium 9.4. WBC 22.7, hemoglobin 16.6, platelets 232, lipase 113.  IMPRESSION:   1.  Urinary tract infection.  2.  Systemic inflammatory response syndrome.  3.  Lactic acidosis.  4.  Diarrhea   5.  Rash, unclear etiology.  6.  Hypertension.  7.  Diabetes.  8.  Atrial fibrillation.   9.  History of  cerebrovascular accident with left-sided weakness.   PLAN OF TREATMENT: 1.  The patient will be admitted to medical floor with telemonitor. We will start on Levaquin and Flagyl to cover  urinary tract infection, and possible Clostridium difficile colitis.   2.  Follow up complete blood count, blood culture, urine culture.  3.  Check stool Clostridium difficile and stool culture.  4.  For rash, we will give Benadryl as needed.   5.  Continue the patient's home hypertension medication.  6.  For diabetes, we will start sliding scale but hold metformin due to lactic acidosis.  7.  Gastrointestinal and deep vein thrombosis prophylaxis.  8.  Continue Coumadin and follow up INR.   I discussed the patient's condition and plan of treatment with the patient, the patient's daughter.   TIME SPENT: About 56 minutes    ____________________________ Shaune PollackQing Letrice Pollok, MD qc:cc D: 02/03/2013 19:16:53 ET T: 02/03/2013 19:50:55 ET JOB#: [id number]  cc: Shaune PollackQing Zackaria Burkey, MD, <Dictator> Shaune PollackQING Sallye Lunz MD ELECTRONICALLY SIGNED 02/07/2013 14:11

## 2014-06-16 NOTE — Consult Note (Signed)
PATIENT NAME:  Jenna Austin, Jenna Austin MR#:  [id number] DATE OF BIRTH:  August 25, 1934  DATE OF CONSULTATION:  02/03/2013  REFERRING PHYSICIAN:   CONSULTING PHYSICIAN:  Marcina MillardAlexander Landon Truax, MD  PRIMARY CARE PHYSICIAN: Dr. Juanetta GoslingHawkins  CHIEF COMPLAINT: Rash.   REASON FOR CONSULTATION: Consultation requested for evaluation of atrial fibrillation.   HISTORY OF PRESENT ILLNESS: The patient is a 80 year old female with history of atrial fibrillation, currently on Coumadin as well as prior history of CVA. The patient apparently was in his usual state of health until the day of admission when she experienced a rash with nausea, vomiting and diarrhea. In the Emergency Room, the patient was noted to have an elevated white count of 22,000 with urinalysis consistent with urinary tract infection. The patient was admitted to telemetry where she has been in atrial fibrillation with variable rate. The patient denies chest pain or palpitations.   PAST MEDICAL HISTORY:  1.  Chronic atrial fibrillation.  2.  Hypertension.  3.  Diabetes.  4.  Prior history of CVA.   MEDICATIONS: Coumadin 3 mg at bedtime, propanolol 40 mg daily, lisinopril half tablet daily, atorvastatin 40 mg daily and metformin 500 mg b.i.d.   SOCIAL HISTORY: The patient denies tobacco or EtOH abuse.   FAMILY HISTORY: No immediate family history for coronary artery disease or myocardial infarction.  REVIEW OF SYSTEMS: CONSTITUTIONAL: No fever or chills. EYES: No blurry vision.  EARS: No hearing loss. RESPIRATORY: No shortness of breath. CARDIOVASCULAR: No chest pain or palpitations. GASTROINTESTINAL: The patient did have nausea, vomiting and diarrhea. GENITOURINARY: No dysuria or hematuria. ENDOCRINE: No polyuria or polydipsia. INTEGUMENTARY: The patient has had a rash, which apparently resolved spontaneously. MUSCULOSKELETAL: No arthralgias or myalgias. NEUROLOGICAL: No focal muscle weakness or numbness. The patient had a prior CVA. PSYCHOLOGICAL: No  depression or anxiety.   PHYSICAL EXAMINATION:  VITAL SIGNS: Blood pressure 125/85, pulse 121 and irregular, respirations 20, temperature 97.3, pulse oximetry 95%.  HEENT: Pupils equal, reactive to light and accommodation.  NECK: Supple without thyromegaly.  LUNGS: Clear.  HEART: Normal JVP. Normal PMI. Irregularly irregular rhythm. Normal S1, S2. No appreciable gallop, murmur, or rub.  ABDOMEN: Soft and nontender. Pulses were intact bilaterally.  MUSCULOSKELETAL: Normal muscle tone.  NEUROLOGIC: The patient is alert and oriented, appeared confused when questioning her about her past medical history. Motor and sensory appear to be grossly intact.   IMPRESSION: A 80 year old female with chronic atrial fibrillation, on Coumadin, who presents with urinary tract infection with rash, which appears to have improved. The patient does have variable rate, which could be related to her underlying UTI. The patient is asymptomatic.   RECOMMENDATIONS:  1.  Continue present meds.  2.  Pending the patient's initial clinical course, may consider up titrating propanolol.  3.  Continue warfarin for stroke prevention.  4.  Would defer further cardiac diagnostics at this time.  ____________________________ Marcina MillardAlexander Arran Fessel, MD ap:aw D: 02/04/2013 09:19:53 ET T: 02/04/2013 09:28:45 ET JOB#: [id number]  cc: Marcina MillardAlexander Harlin Mazzoni, MD, <Dictator> Marcina MillardALEXANDER Boston Catarino MD ELECTRONICALLY SIGNED 02/11/2013 8:51

## 2014-06-16 NOTE — Discharge Summary (Signed)
PATIENT NAME:  Jenna Austin, Jenna Austin MR#:  [id number] DATE OF BIRTH:  [birth year]  DATE OF ADMISSION:  02/03/2013 DATE OF DISCHARGE:  02/04/2013  DISCHARGE DIAGNOSES: 1.  Urinary tract infection.  2.  Sepsis.  3.  Atrial fibrillation with rapid ventricular rate.  4.  Hypertension.  5.  Diabetes mellitus.  6.  History of cerebrovascular accident.  7.  Dehydration.   CONSULTATIONS: Dr. Darrold JunkerParaschos of cardiology.   ADMITTING HISTORY AND PHYSICAL AND HOSPITAL COURSE: Please see detailed H and P dictated by Dr. Imogene Burnhen previously. In brief, a 80 year old female patient with history of chronic atrial fibrillation, hypertension, presented to the hospital complaining of nausea, vomiting, diarrhea, rash. The patient was found to have a UTI, admitted to the hospitalist service, along with sepsis and A. fib with rapid ventricular rate.   The patient was started on antibiotics IV. Cultures have been negative to date. The patient is afebrile. White count has trended down to a normal range. The patient is feeling better. Cardizem was added to her medication for atrial fibrillation. Heart rate is 82 prior to discharge with blood pressure of 110/67. The patient feels back to baseline. Discussed case with the daughter. PT has seen the patient and recommended home health, and the patient will be discharged back home in a stable condition. The patient's rash has resolved completely. It is not clear what the etiology is, but she did receive a dose of Benadryl in the Emergency Room. Her nausea, vomiting, diarrhea are likely viral gastroenteritis.   DISCHARGE MEDICATIONS: Include:  1.  Metformin 500 mg oral 2 times a day.  2.  Atorvastatin 40 mg oral once a day.  3.  Propranolol 40 mg oral once a day.  4.  Warfarin 4 mg oral once a day.  5.  Cardizem CD 120 mg oral once a day.  6.  Levaquin 250 mg oral once a day for 5 days.   DISCHARGE INSTRUCTIONS: Low-sodium, regular consistency diet. Activity as tolerated. Follow up with  primary care physician in 1 to 2 weeks. The patient's Coumadin levels were mildly high at 3.0, for which we will hold Coumadin today and restart from tomorrow, and she will follow up with primary care physician for an INR check.   Discharge time spent today on this case was 40 minutes.   ____________________________ Molinda BailiffSrikar R. Maiya Kates, MD srs:jcm D: 02/04/2013 14:20:10 ET T: 02/04/2013 15:43:15 ET JOB#: [id number]  cc: Wardell HeathSrikar R. Elpidio AnisSudini, MD, <Dictator> Janeann ForehandJames H. Hawkins Jr., MD Orie FishermanSRIKAR R Manuela Halbur MD ELECTRONICALLY SIGNED 02/13/2013 10:37

## 2014-06-17 NOTE — Consult Note (Signed)
Brief Consult Note: Diagnosis: afib RVR.   Patient was seen by consultant.   Consult note dictated.   Orders entered.   Comments: 78yCF pmh severe dementia, htn, afib, recent  (11-25-13) subdural hematoma/subarachnoid hemorrhage p/w ischemic left leg - underwent thromboemolectomy and 4 compartment fasciotomy  1. ischemic left leg: defer care to vasc surgery, obvious concern with the use of heparin given recent SDH andd sub arachnoid - however risk/benefit had already been discussed with family  2. afib RVR: HR 140s, pt unable to take po at this time, cardizem 10mg  IV x1, start gtt at 5mg /h goal HR<120  3. DM2: iss, q6h accu  4. hypoK: replace goal 4-5  5. elevated trop: trend CEx3  6. vte px: currently hep gtt  FULL.  Electronic Signatures: Hower, Cletis Athensavid K (MD)  (Signed 25-Oct-15 23:54)  Authored: Brief Consult Note   Last Updated: 25-Oct-15 23:54 by Wyatt HasteHower, David K (MD)

## 2014-06-17 NOTE — Consult Note (Signed)
   Comments   Jeneen Rinks, NP, and I met with pt's husband, son and 2 daughters. Updated family on pt's current condition. I presented the options of amputation vs no amputation and comfort care with [facility]. Family agree that pt's quality of life is very poor and that it has gotten worse with each event she has had over the past 6 months. They agree with comfort care at the [facility]. Ginny Ward, RN, liason for the [facility] notified and will see pt. Comfort care orders entered.   Electronic Signatures: Normon Pettijohn, Izora Gala (MD)  (Signed 26-Oct-15 12:52)  Authored: Palliative Care   Last Updated: 26-Oct-15 12:52 by Layth Cerezo, Izora Gala (MD)

## 2014-06-17 NOTE — Consult Note (Signed)
Referring Physician:  Hortencia Pilar :   Primary Care Physician:  [facility] [address], [place], [place] [address], [phone]  Reason for Consult: Admit Date: 18-Dec-2013  Chief Complaint: ischemic L leg  Reason for Consult: old hemorrhage   History of Present Illness: History of Present Illness:   80 yo RHD F presents to [facility] secondary to painful left leg.  It was noted that this L leg was ischemic and pt immediately went to surgery and fasciotomy.  Neurology was consulted due to recent traumatic [place] on 11/25/13 and whether not it is ok to start anticoagulation.  Pt has baseline severe dementia and does not follow commands.  ROS:  Review of Systems   unobtainable secondary to dementia  Past Medical/Surgical Hx:  dementia:   UTI:   Atrial Fibrillation:   CVA/Stroke:   Diabetes - Borderline:   HTN:   cataract surgery- bilateral eyes:   hysterectomy:   Past Medical/ Surgical Hx:  Past Medical History reviewed by me as above   Past Surgical History reviewed by me as above   Home Medications: Medication Instructions Last Modified Date/Time  Zofran ODT 4 mg oral tablet, disintegrating 1 tab(s) orally every 6 hours, As Needed- for Nausea, Vomiting  26-Oct-15 12:59  LORazepam 0.5 mg oral tablet 1-2 tab(s) orally/sublingual every 2 to 4 hours as needed for agitation/anxiety 26-Oct-15 12:59  morphine 20 mg/mL oral concentrate 0.5 mL to 1 mL (10-20 mg) orally every 1 to 2 hours as needed for pain/dysphagia 26-Oct-15 12:59   Allergies:  No Known Allergies:   Allergies:  Allergies NKDA   Social/Family History: Employment Status: retired  Lives With: alone  Living Arrangements: extended care facility  Social History: no tob, no EtOH, no illicits  Family History: no strokes, no seizures   Vital Signs: **Vital Signs.:   26-Oct-15 08:00  Vital Signs Type Routine  Temperature Temperature (F) 97.7  Celsius 36.5   Temperature Source oral  Pulse Pulse 118  Pulse source if not from Vital Sign Device per cardiac monitor  Respirations Respirations 15  Systolic BP Systolic BP 474  Diastolic BP (mmHg) Diastolic BP (mmHg) 70  Mean BP 83  BP Source  if not from Vital Sign Device non-invasive  Pulse Ox % Pulse Ox % 92  Pulse Ox Activity Level  At rest  Oxygen Delivery 2L; Nasal Cannula  Pulse Ox Heart Rate 121   Physical Exam: General: slightly overweight, mild distress  HEENT: normocephalic, sclera nonicteric, oropharynx clear  Neck: supple, no JVD, no bruits  Chest: CTA B, no wheezing, good movement  Cardiac: RRR, no murmurs, no edema, 2+ pulses  Extremities: absent L arm, L arm fascitomy   Neurologic Exam: Mental Status: sleepy but oriented only to person, rarely follows simple commands, trace dysarthria  Cranial Nerves: PERRLA, EOMI, nl VF, face symmetric, tongue midline, shoulder shrug equal  Motor Exam: decreased movement on L , normal movement on R  Deep Tendon Reflexes: 1+/4 B, downgoing R plantar  Sensory Exam: grimaces to pain in all 4   Lab Results: Hepatic:  25-Oct-15 15:45   Bilirubin, Total 0.6  Alkaline Phosphatase  158 (46-116 NOTE: New Reference Range 09/13/13)  SGPT (ALT) 33 (14-63 NOTE: New Reference Range 09/13/13)  SGOT (AST)  68  Total Protein, Serum  6.3  Albumin, Serum  2.8  Routine BB:  25-Oct-15 15:45   ABO Group + Rh Type A Positive  Antibody Screen NEGATIVE (Result(s) reported on  18 Dec 2013 at 05:32PM.)  Routine Chem:  26-Oct-15 07:15   Glucose, Serum  372  BUN 10  Creatinine (comp) 0.70  Sodium, Serum 145  Potassium, Serum 4.8  Chloride, Serum  112  CO2, Serum 28  Calcium (Total), Serum  8.2  Anion Gap  5  Osmolality (calc) 303  eGFR (African American) >60  eGFR (Non-African American) >60 (eGFR values <60m/min/1.73 m2 may be an indication of chronic kidney disease (CKD). Calculated eGFR, using the MRDR Study equation, is useful in  patients  with stable renal function. The eGFR calculation will not be reliable in acutely ill patients when serum creatinine is changing rapidly. It is not useful in patients on dialysis. The eGFR calculation may not be applicable to patients at the low and high extremes of body sizes, pregnant women, and vetetarians.)  Result Comment TROPONIN - Previously called on 12/18/13 @1630   - by [email or web address]  - RESULTS VERIFIED BY REPEAT TESTING.  Result(s) reported on 19 Dec 2013 at 08:09AM.  Cardiac:  25-Oct-15 15:45   CK, Total  2348 (26-192 NOTE: NEW REFERENCE RANGE  03/28/2013)  CPK-MB, Serum  12.9  26-Oct-15 07:15   Troponin I  0.09 (0.00-0.05 0.05 ng/mL or less: NEGATIVE  Repeat testing in 3-6 hrs  if clinically indicated. >0.05 ng/mL: POTENTIAL  MYOCARDIAL INJURY. Repeat  testing in 3-6 hrs if  clinically indicated. NOTE: An increase or decrease  of 30% or more on serial  testing suggests a  clinically important change)  Routine UA:  25-Oct-15 15:45   Color (UA) Yellow  Clarity (UA) Clear  Glucose (UA) >=500  Bilirubin (UA) Negative  Ketones (UA) Negative  Specific Gravity (UA) 1.026  Blood (UA) Negative  pH (UA) 5.0  Protein (UA) Negative  Nitrite (UA) Negative  Leukocyte Esterase (UA) Negative (Result(s) reported on 18 Dec 2013 at 05:56PM.)  RBC (UA) 1 /HPF  WBC (UA) 3 /HPF  Bacteria (UA) NONE SEEN  Epithelial Cells (UA) <1 /HPF  Mucous (UA) PRESENT  Hyaline Cast (UA) 3 /LPF (Result(s) reported on 18 Dec 2013 at 05:56PM.)  Routine Coag:  25-Oct-15 15:45   Prothrombin 14.5  INR 1.1 (INR reference interval applies to patients on anticoagulant therapy. A single INR therapeutic range for coumarins is not optimal for all indications; however, the suggested range for most indications is 2.0 - 3.0. Exceptions to the INR Reference Range may include: Prosthetic heart valves, acute myocardial infarction, prevention of myocardial infarction, and combinations of aspirin and  anticoagulant. The need for a higher or lower target INR must be assessed individually. Reference: The Pharmacology and Management of the Vitamin K  antagonists: the seventh ACCP Conference on Antithrombotic and Thrombolytic Therapy. CBMWUX.3244Sept:126 (3suppl): [id number] A HCT value >55% may artifactually increase the PT.  In one study,  the increase was an average of 25%. Reference:  "Effect on Routine and Special Coagulation Testing Values of Citrate Anticoagulant Adjustment in Patients with High HCT Values." American Journal of Clinical Pathology 2006;126:400-405.)  26-Oct-15 11:14   Activated PTT (APTT) 29.7 (A HCT value >55% may artifactually increase the APTT. In one study, the increase was an average of 19%. Reference: "Effect on Routine and Special Coagulation Testing Values of Citrate Anticoagulant Adjustment in Patients with High HCT Values." American Journal of Clinical Pathology 2006;126:400-405.)  Routine Hem:  26-Oct-15 02:21   WBC (CBC)  17.3  RBC (CBC) 4.07  Hemoglobin (CBC)  11.5  Hematocrit (CBC) 37.2  Platelet Count (CBC)  135  MCV 92  MCH 28.2  MCHC  30.8  RDW  15.8  Neutrophil % 95.5  Lymphocyte % 2.2  Monocyte % 1.6  Eosinophil % 0.1  Basophil % 0.6  Neutrophil #  16.5  Lymphocyte #  0.4  Monocyte # 0.3  Eosinophil # 0.0  Basophil # 0.1 (Result(s) reported on 19 Dec 2013 at 08:48AM.)   Radiology Results: CT:    26-Oct-15 08:38, CT Head Without Contrast  CT Head Without Contrast   REASON FOR EXAM:    re-evaluate subdural hematoma and subarachnoid   hemorrhage  COMMENTS:       PROCEDURE: CT  - CT HEAD WITHOUT CONTRAST  - Dec 19 2013  8:38AM     CLINICAL DATA:  Patient with prior history of intracranial  hemorrhage status post fall. History of atrial fibrillation. Now  with history of cold left foot.    EXAM:  CT HEAD WITHOUT CONTRAST    TECHNIQUE:  Contiguous axial images were obtained from the base of the skull  through the vertex  without intravenous contrast.    COMPARISON:  Brain CT 11/25/2013    FINDINGS:  Ventricles and sulci are prominent compatible with atrophy.  Periventricular and subcortical white matter hypodensities  compatible with chronic small vessel ischemic change. Chronic right  external capsule infarct. Previously described acute subdural and  subarachnoid hemorrhage overlying the left cerebral hemisphere has  evolved. Residual low density fluid is present measuring  approximately 3 mm without significant mass effect.    Within the posterior aspect of the right occipital lobe (image 12)  as well as along the posterior aspect of the falx (image 17) there  is a small amount of high attenuation which may represent  redistribution of blood products or superimposed acute blood  products. Possible small amount of increased attenuation within the  posterior aspect of the left occipital lobe (image 9). The paranasal  sinuses are unremarkable. Calvarium is intact. Orbits are  unremarkable.     IMPRESSION:  Interval evolution of previously describedsubdural hematoma  overlying the left cerebral hemisphere with residual low-density  fluid measuring up to 3 mm, without significant mass effect.    There is high attenuation within the posterior aspect of the right  and possibly left occipital lobesas well as along the posterior  aspect of the falx which may represent redistribution of prior blood  products, however given the increased attenuation, acute  superimposed blood products are not excluded.    Critical Value/emergent results were called by telephone at the time  of interpretation on 12/19/2013 at 9:20 am to Dr. Margaretmary Eddy, who  verbally acknowledged these results.      Electronically Signed    By: Lovey Newcomer M.D.    On: 12/19/2013 09:18         Verified By: Ilsa Iha, M.D.,   Radiology Impression: Radiology Impression: CT of head personally reviewed by me and shows no evidence of old  hemorrhage   Impression/Recommendations: Recommendations:   prior notes reviewed by me  reviewed by me   Old L SDH and SAH-  these were traumatic and have appeared to resorbed;  prior MRI shows no evidence of amyloid angiopathy as well Dementia vs. encephalopathy-  pt has baseline severe dementia requiring NH and probably has some medication induced encephalopathy from pain medications from Neurologic standpoint, I disagree with radiology read of recent CT and believe that it is reasonably safe to start anticoagulation now but do not recommend heparin bridging if anticoagulation is started  and pt declines more, recommend repeat CT and re-consulting neurology will sign off, please call with questions  Electronic Signatures: Jamison Neighbor (MD)  (Signed 26-Oct-15 14:23)  Authored: REFERRING PHYSICIAN, Primary Care Physician, Consult, History of Present Illness, Review of Systems, PAST MEDICAL/SURGICAL HISTORY, HOME MEDICATIONS, ALLERGIES, Social/Family History, NURSING VITAL SIGNS, Physical Exam-, LAB RESULTS, RADIOLOGY RESULTS, Recommendations   Last Updated: 26-Oct-15 14:23 by Jamison Neighbor (MD)

## 2014-06-17 NOTE — Op Note (Signed)
PATIENT NAME:  Jenna Austin, Jenna Austin MR#:  [id number] DATE OF BIRTH:  [birth year]  DATE OF PROCEDURE:  07/07/2013  PREOPERATIVE DIAGNOSIS:  Left femoral neck fracture and osteoarthritis.   POSTOPERATIVE DIAGNOSIS:  Left femoral neck fracture and osteoarthritis.   PROCEDURE PERFORMED:  Left total hip replacement, direct anterior approach.   ANESTHESIA:  Spinal.   SURGEON:  Kennedy BuckerMichael Doniven Vanpatten, M.D.   DESCRIPTION OF PROCEDURE:  The patient was brought to the operating room and after adequate anesthesia was obtained, the patient was placed on the operative table with the right leg on a well-padded table with the left foot in a Medacta attachment. C-arm was brought in and good visualization of the hip could be obtained with traction view used as a template. The hip was then prepped and draped in standard fashion and appropriate patient identification and timeout procedures were completed. Direct anterior approach was made with an oblique incision distal to the hip flexion crease to improve potential for wound healing. The incision was then extended down through the subcutaneous tissue. The tensor fascia muscle was identified and its fascia was incised longitudinally. The muscle was retracted laterally and deep retractors placed. Deep fascia was incised and the lateral femoral circumflex vessels identified and ligated. The anterior capsule of the hip was then exposed and an anterior capsulotomy carried out with deep retractors placed. The femoral neck cut was made. The head and neck were removed. There was mild arthritis to the head. The acetabulum was prepared with excision of the labrum and then sequential reaming to 52 mm. A 52 mm trial fit well and the 52 cup was impacted without difficulty and was very stable and in appropriate position. The leg was then externally rotated. The pubofemoral and ischiofemoral ligaments released and the leg brought into extension. Sequential broaching was carried out to a size 5. With the  size 5 and the short head, leg lengths appeared to be equal and these were chosen as the final components. The size 5 AMIS collared stem was impacted into place with the S-28 mm head and 52 mm Versafitcup DM liner assembled, impacted onto the head and the hip was reduced. It was stable to a 90 degree external rotation test. X-ray appeared to show restoration of leg length. The wound was thoroughly irrigated. 30 mL of 0.25% Sensorcaine with epinephrine was infiltrated in the periarticular tissue for aid in postoperative analgesia. The deep fascia was repaired using a heavy Quill suture with subcutaneous drain placed, 2-0 Quill subcutaneously and skin staples. Xeroform, 4 x 4's, ABD and tape applied. The patient was sent to the recovery room in stable condition.   ESTIMATED BLOOD LOSS:  200 mL.   COMPLICATIONS:  None.   SPECIMENS:  Removed femoral head.   IMPLANTS: Medacta AMIS size 5 collared stem, a Versafitcup DM  52 mm with liner and S-28 mm head.     ____________________________ Leitha SchullerMichael J. Bette Brienza, MD mjm:dmm D: 07/07/2013 10:55:41 ET T: 07/07/2013 11:07:04 ET JOB#: [id number]  cc: Leitha SchullerMichael J. Tymir Terral, MD, <Dictator> Leitha SchullerMICHAEL J Shai Rasmussen MD ELECTRONICALLY SIGNED 07/07/2013 16:58

## 2014-06-17 NOTE — Consult Note (Signed)
Chief Complaint:  Subjective/Chief Complaint Pt sitting chair today denies cp or palp. Hip pain improved   VITAL SIGNS/ANCILLARY NOTES: **Vital Signs.:   16-May-15 07:00  Vital Signs Type Routine  Temperature Source oral  Pulse Pulse 116  Pulse source if not from Vital Sign Device per cardiac monitor  Respirations Respirations 22  Systolic BP Systolic BP 114  Diastolic BP (mmHg) Diastolic BP (mmHg) 84  Mean BP 94  Pulse Ox % Pulse Ox % 93  Pulse Ox Activity Level  At rest  Oxygen Delivery Room Air/ 21 %  Pulse Ox Heart Rate 118  Telemetry pattern Cardiac Rhythm Atrial fibrillation; Tachycardia  *Intake and Output.:   16-May-15 07:00  Grand Totals Intake:  40 Output:  20    Net:  20 24 Hr.:  970  IV (Primary)      In:  40  Other Output ml     Out:  20   Brief Assessment:  GEN well developed, well nourished, no acute distress   Cardiac Irregular  murmur present   Respiratory normal resp effort  rhonchi   Gastrointestinal Normal   Gastrointestinal details normal Soft  Nontender  Nondistended  No masses palpable   EXTR negative cyanosis/clubbing, negative edema   Lab Results: Routine Coag:  14-May-15 06:03   Prothrombin 14.6  INR 1.2 (INR reference interval applies to patients on anticoagulant therapy. A single INR therapeutic range for coumarins is not optimal for all indications; however, the suggested range for most indications is 2.0 - 3.0. Exceptions to the INR Reference Range may include: Prosthetic heart valves, acute myocardial infarction, prevention of myocardial infarction, and combinations of aspirin and anticoagulant. The need for a higher or lower target INR must be assessed individually. Reference: The Pharmacology and Management of the Vitamin K  antagonists: the seventh ACCP Conference on Antithrombotic and Thrombolytic Therapy. Chest.2004 Sept:126 (3suppl): L7870634. A HCT value >55% may artifactually increase the PT.  In one study,  the  increase was an average of 25%. Reference:  "Effect on Routine and Special Coagulation Testing Values of Citrate Anticoagulant Adjustment in Patients with High HCT Values." American Journal of Clinical Pathology 2006;126:400-405.)  16-May-15 04:26   Prothrombin  25.6  INR 2.4 (INR reference interval applies to patients on anticoagulant therapy. A single INR therapeutic range for coumarins is not optimal for all indications; however, the suggested range for most indications is 2.0 - 3.0. Exceptions to the INR Reference Range may include: Prosthetic heart valves, acute myocardial infarction, prevention of myocardial infarction, and combinations of aspirin and anticoagulant. The need for a higher or lower target INR must be assessed individually. Reference: The Pharmacology and Management of the Vitamin K  antagonists: the seventh ACCP Conference on Antithrombotic and Thrombolytic Therapy. Chest.2004 Sept:126 (3suppl): L7870634. A HCT value >55% may artifactually increase the PT.  In one study,  the increase was an average of 25%. Reference:  "Effect on Routine and Special Coagulation Testing Values of Citrate Anticoagulant Adjustment in Patients with High HCT Values." American Journal of Clinical Pathology 2006;126:400-405.)  Routine Hem:  16-May-15 04:26   WBC (CBC) 11.0  RBC (CBC)  3.67  Hemoglobin (CBC)  10.9  Hematocrit (CBC)  33.1  Platelet Count (CBC)  107 (Result(s) reported on 09 Jul 2013 at 05:58AM.)  MCV 90  MCH 29.6  MCHC 32.7  RDW 14.1   Radiology Results: XRay:    12-May-15 06:05, Chest 1 View AP or PA  Chest 1 View AP or PA  REASON FOR EXAM:    fall  COMMENTS:       PROCEDURE: DXR - DXR CHEST 1 VIEWAP OR PA  - Jul 05 2013  6:05AM     CLINICAL DATA:  Fall.    EXAM:  CHEST - 1 VIEW    COMPARISON:  DG CHEST 1V PORT dated 02/03/2013    FINDINGS:  The heart size and mediastinal contours are within normal limits.  Both lungs are clear. The visualized  skeletal structures are  unremarkable.     IMPRESSION:  No active disease.      Electronically Signed    By: Burman Nieves M.D.    On: 07/05/2013 06:08         Verified [id number] R. Andria Meuse, M.D.,    12-May-15 06:05, Femur Left  Femur Left   REASON FOR EXAM:    pain sp fall  COMMENTS:       PROCEDURE: DXR - DXR FEMUR LEFT  - Jul 05 2013  6:05AM     CLINICAL DATA:  Left-sided pain after a fall.    EXAM:  LEFT FEMUR - 2 VIEW    COMPARISON:  None.    FINDINGS:  Transverse fracture of the left femoral neck with varus angulation  of the fracture fragments. Distal femur is otherwise unremarkable.  Vascular calcifications in the soft tissues. Left knee appears  intact without evidence of effusion.     IMPRESSION:  Transverse fracture left femoral neck with varus angulation. Distal  femur is intact.      Electronically Signed    By: Burman Nieves M.D.    On: 07/05/2013 06:08         Verified By: Marlon Pel, M.D.,    12-May-15 06:05, Pelvis AP Only  Pelvis AP Only   REASON FOR EXAM:    left hip pain sp fall  COMMENTS:       PROCEDURE: DXR - DXR PELVIS AP ONLY  - Jul 05 2013  6:05AM     CLINICAL DATA:  Left-sided pain and difficult mobility after a fall.    EXAM:  PELVIS - 1-2 VIEW    COMPARISON:  None.    FINDINGS:  Transverse fracture through the left femoral neck with superior  displacement of the distal fracture fragment resulting in varus  angulation. Pelvis appears intact. Degenerative changes in the lower  lumbar spine and hips.     IMPRESSION:  Transverse fracture of the left femoral neck with varus angulation.      Electronically Signed    By: Burman Nieves M.D.    On: 07/05/2013 06:07         Verified By: Marlon Pel, M.D.,    14-May-15 10:29, Hip Left One View  Hip Left One View   REASON FOR EXAM:    Fracture reduction/fixation  COMMENTS:       PROCEDURE: DXR - DXR HIP LEFT ONE VIEW  - Jul 07 2013 10:29AM      CLINICAL DATA:  Fracture left hip.    EXAM:  LEFT HIP - 1 VIEW:; DG C-ARM 61-120 MIN    COMPARISON:  07/05/2013.    FINDINGS:  Four C-arm views submitted for review after surgery. Placement of  left total hip prosthesis. Femoral component is only partially  visualized. Radiopaque component does not appear seated upon the  calcar region. This can be assessed on follow up.     IMPRESSION:  Four C-arm views submitted for review after surgery. Placement of  left total hip prosthesis. Femoral component is only partially  visualized. Radiopaque component does not appear seated upon the  calcar region. This can be assessed on follow up.      Electronically Signed    By: Bridgett LarssonSteve  Olson M.D.    On: 07/07/2013 10:34       Verified By: Fuller CanadaSTEVEN R. OLSON, M.D.,    14-May-15 11:37, Hip Left Complete  Hip Left Complete   REASON FOR EXAM:    s/p THA  COMMENTS:   Bedside (portable):Y    PROCEDURE: DXR - DXR HIP LEFT COMPLETE  - Jul 07 2013 11:37AM     CLINICAL DATA:  Post total hip arthroplasty    EXAM:  LEFT HIP - COMPLETE 2+ VIEW    COMPARISON:  Portable exam 1136 hrcompared intraoperative images of  07/07/2013    FINDINGS:  LEFT hip prosthesis identified in expected position.  No acute fracture or dislocation.    No periprosthetic lucency.    Surgical drain and overlying skin clips present.     IMPRESSION:  LEFT hip prosthesis without acute complication.      Electronically Signed    By: Ulyses SouthwardMark  Boles M.D.    On: 07/07/2013 13:17       Verified By: Lollie MarrowMARK A. BOLES, M.D.,    16-May-15 09:20, Chest Portable Single View  Chest Portable Single View   REASON FOR EXAM:    low o2 sats  COMMENTS:       PROCEDURE: DXR - DXR PORTABLE CHEST SINGLE VIEW  - Jul 09 2013  9:20AM     CLINICAL DATA:  Hypoxia    EXAM:  PORTABLE CHEST - 1 VIEW    COMPARISON:  07/05/2013    FINDINGS:  Mild cardiomegaly which is stable from previous. Unremarkable upper  mediastinal contours for  portable exam. Mild pulmonary  hyperinflation. Apparent opacity at the peripheral left base could  be from under penetration. No edema, consolidation, effusion, or  pneumothorax.     IMPRESSION:  Stable exam.  No evidence of acute cardiopulmonary disease.      Electronically Signed    By: Tiburcio PeaJonathan  Watts M.D.    On: 07/09/2013 09:46         Verified By: Audry RilesJONATHAN M. WATTS, M.D.,  Fluoroscopy:    14-May-15 10:28, C-Arm Over 1 Hour Without Spots  C-Arm Over 1 Hour Without Spots   REASON FOR EXAM:    fractured hip  COMMENTS:       PROCEDURE: DXR - DXR C-ARM OVER ONE HOUR  - Jul 07 2013 10:28AM     CLINICAL DATA:  Fracture left hip.    EXAM:  LEFT HIP - 1 VIEW:; DG C-ARM 61-120 MIN    COMPARISON:  07/05/2013.    FINDINGS:  FourC-arm views submitted for review after surgery. Placement of  left total hip prosthesis. Femoral component is only partially  visualized. Radiopaque component does not appear seated upon the  calcar region. This can be assessed on follow up.     IMPRESSION:  Four C-arm views submitted for review after surgery. Placement of  left total hip prosthesis. Femoral component is only partially  visualized. Radiopaque component does not appear seated upon the  calcar region. This can be assessed on followup.      Electronically Signed    By: Bridgett LarssonSteve  Olson M.D.    On: 07/07/2013 10:34       Verified By: Fuller CanadaSTEVEN R. OLSON, M.D.,  Cardiology:    12-May-15 05:09, ECG  Ventricular Rate 122  Atrial Rate 138  QRS Duration 68  QT 374  QTc 532  R Axis 87  T Axis -138  ECG interpretation   Atrial fibrillation with rapid ventricular response  Low voltage QRS  Septal infarct , age undetermined  Abnormal ECG  When compared with ECG of 07-Jan-1994 15:19,  Significant changes have occurred  ----------unconfirmed----------  Confirmed by OVERREAD, NOT ([id number]), editor PEARSON, BARBARA ([id number]) on 07/06/2013 12:48:52 PM  ECG    Assessment/Plan:  Invasive Device  Daily Assessment of Necessity:  Does the patient currently have any of the following indwelling devices? foley   Indwelling Urinary Catheter continued, requirement due to   Reason to continue Indwelling Urinary Catheter Immobilization due to pelvic/hip fracture or ortho procedure necessitating immobilization   Assessment/Plan:  Assessment IMP S/P Fx fem head Afib RVR Dementia Anticoug SOB .   Plan PLAN Rate control for AFIB Add sotolol 80mg  daily for rhythm Continue b-blockers for rate Still on low dose cardiazem which we may stop after sotolol Dementia stable for now PT/OT Continue anticoug Ok to transferto ortho floor   Electronic Signatures: Dorothyann Peng D (MD)  (Signed 16-May-15 11:34)  Authored: Chief Complaint, VITAL SIGNS/ANCILLARY NOTES, Brief Assessment, Lab Results, Radiology Results, Assessment/Plan   Last Updated: 16-May-15 11:34 by Alwyn Pea (MD)

## 2014-06-17 NOTE — Discharge Summary (Signed)
PATIENT NAME:  Jenna Austin, Jenna Austin MR#:  [id number] DATE OF BIRTH:  [birth year]  DATE OF ADMISSION:  12/18/2013 DATE OF DISCHARGE:  12/19/2013  PREOPERATIVE DIAGNOSES:  1.  Ischemic left lower extremity.  2.  Atrial fibrillation with embolization to the left lower extremity.  3.  Peripheral arterial disease.   OPERATIONS PERFORMED:  1.  Thromboembolectomy of left common and external iliac artery via femoral approach.  2.  Embolectomy of the superficial femoral and deep femoral artery, left lower extremity from femoral artery approach.  3.  Four compartment fasciotomy.   CONSULTATIONS: [facility] for medical management, neurology consult for evaluation of intracranial bleed.   HISTORY: Jenna Austin is a 80 year old woman who presented to the Emergency Room with profound ischemia of her left lower extremity. Of note, she had been admitted to the hospital approximately 3 weeks earlier after sustaining a fall while on her anticoagulation for her atrial fibrillation. Secondary to the trauma, she developed a subdural hematoma, as well as a fracture of the left arm. Because of that her anticoagulation was stopped. She now presents with embolization secondary to her atrial fibrillation with critical limb ischemia. After lengthy discussions with the family including the fact that heparin is required for vascular surgeries to be successful, the family asked that we proceed with embolectomy for limb salvage understanding a judicious use of anticoagulation will be employed.   HOSPITAL COURSE: On the day of admission, she underwent surgical thrombectomy as described above. She did well from this and was maintained on a small dose of heparin. Her PTTs were actually within the normal range. Unfortunately, the following day, postoperative day #1, she did exhibit some significant neuro changes. A neurology consult, as well as palliative care consults were obtained. After confirmation, it appears that she had further  bleeding intracranially. The family requested palliative care and she is being transferred to hospice. She was transferred to hospice in guarded condition. Further arrangements will be made by the hospice team.    ____________________________ Renford DillsGregory G. Nadra Hritz, MD ggs:ts D: 01/04/2014 13:29:00 ET T: 01/04/2014 14:11:43 ET JOB#: [id number]  cc: Renford DillsGregory G. Anjani Feuerborn, MD, <Dictator> Renford DillsGREGORY G Cristi Gwynn MD ELECTRONICALLY SIGNED 01/24/2014 12:56

## 2014-06-17 NOTE — Consult Note (Signed)
PATIENT NAME:  Jenna Austin, Jenna Austin MR#:  [id number] DATE OF BIRTH:  August 02, 1934  DATE OF CONSULTATION:  12/18/2013  REFERRING PHYSICIAN:  Renford DillsGregory G. Schnier, MD  CONSULTING PHYSICIAN:  Cletis Athensavid K. Hower, MD  PRIMARY CARE PHYSICIAN:  Edward L. Juanetta GoslingHawkins, MD  REASON FOR CONSULTATION:  Atrial fibrillation.   HISTORY OF PRESENT ILLNESS:  This is a 80 year old Caucasian female with past medical history of severe dementia, hypertension, and atrial fibrillation, as well as subdural hematoma and subarachnoid hemorrhage on 11/25/2013. She is presenting with an ischemic left leg. [facility] who back on 11/25/2013, suffered a mechanical fall and subsequently developed a subdural hematoma, as well as subarachnoid hemorrhage. [facility] While there, the nursing staff noticed that she had a cold, blue left foot, thus sent to the hospital for further workup and evaluation. The patient herself is unable to provide any meaningful information given baseline mental status. She was evaluated by Dr. Gilda CreaseSchnier of vascular surgery and admitted to his service. She was then subsequently taken to the operating room for thromboembolectomy of the left iliac, common femoral, superficial as well as profunda femoral arteries and underwent four-compartment fasciotomy of the left leg with estimated blood loss of 50 mL at that time and then brought to the intensive care unit, where she was found to be in atrial fibrillation with rapid ventricular response, heart rate in the 140s to 150s. Hospitalist service was asked for medical consult regarding her atrial fibrillation.   REVIEW OF SYSTEMS:  Unobtainable at this time given the patient's baseline mental status.   PAST MEDICAL HISTORY: Hypertension; type 2 diabetes, non-insulin-requiring; atrial fibrillation, who has been off of Coumadin therapy after subdural hematoma; severe dementia; history of subdural  hematoma and subarachnoid hemorrhage on 11/25/2013, after a fall.   FAMILY HISTORY:  Positive for hypertension.   SOCIAL HISTORY:  [facility], essentially nonconversant at baseline. Per documentation, no evidence of tobacco or drug usage.   ALLERGIES:  No known drug allergies.   HOME MEDICATIONS:  Include Cardizem 60 mg p.o. q. 6 hours, sotalol 80 mg p.o. daily.   PHYSICAL EXAMINATION: VITAL SIGNS:  Temperature 98.5, heart rate of 129, respirations 14, blood pressure 126/78, saturating 97% on 2 liters nasal cannula. Weight 160.2 kg,  GENERAL:  Chronically ill-appearing Caucasian female, currently in no acute distress.  HEAD:  Normocephalic, atraumatic.  EYES:  Pupils equal, round, and reactive to light. Extraocular muscles are intact. No scleral icterus.  MOUTH:  Dry mucosal membrane. Dentition is poor. No abscess noted.  EARS, NOSE, AND THROAT:  Clear without exudates. No external lesions.  NECK:  Supple. No thyromegaly. No nodules. No JVD.  PULMONARY:  Diminished breath sounds bilaterally; however, no frank wheezes, rales, or rhonchi. No use of accessory muscles. Good respiratory effort.  CHEST:  Nontender to palpation.  CARDIOVASCULAR:  S1, S2, irregular rate, irregular rhythm. No murmurs, rubs, or gallops. There is trace pedal edema bilaterally. Pedal pulses diminished on the right, absent on the left.  ABDOMEN:  Soft, nontender, nondistended. No masses. Positive bowel sounds. No hepatosplenomegaly.  MUSCULOSKELETAL:  The left lower extremity fasciotomy is with wound VAC in place. Passive range of motion full in all extremities. She is spontaneously moving her upper extremities at this time. Left lower extremity is also cyanotic.  NEUROLOGIC:  Unable to fully assess at this time given the patient's baseline mental status. She is unable  to follow simple commands. She does, however, have spontaneous movement of upper extremities as well as pupillary reflex.  SKIN:  Left  lower extremity from shin downwards is cyanotic and mottled. Her previous fasciotomy is now covered with wound VAC. Otherwise, no further rashes or lesions.  PSYCHIATRIC:  Unable to fully assess at this time given the patient's baseline mental status. She once again opens her eyes to verbal commands, however, is unable to follow any commands or provide any meaningful information.   LABORATORY DATA:  Sodium 144, potassium 3.4, chloride 109, bicarbonate 25, BUN 13, creatinine 0.92, glucose 400. LFTs: Total protein 6.3, albumin 2.8, bilirubin 0.6, alkaline phosphatase 158, AST 60, ALT 33. Troponin I is 0.12. CK-MB is 12.9, CK 2348. WBC is 16.6, hemoglobin 12.7, and platelets of 161. Urinalysis is negative for evidence of infection.   IMAGING:  Chest x-ray performed reveals no acute cardiopulmonary process. CT of the head performed on 11/25/2013, reveals left frontal parietotemporal subdural hematoma measuring 5 mm as well as focal right parafalcine subdural hematoma and subarachnoid hemorrhage of the left posterior frontotemporal and parietal regions and the right anterior frontal region as well. Also concern for probable intraventricular hemorrhage. At that time, there was a 5 mm left to right midline shift.   ASSESSMENT AND PLAN:  This is a 80 year old Caucasian female with a history of severe dementia, hypertension, and atrial fibrillation, as well as recent 11/25/2013, subdural hematoma and subarachnoid hemorrhage, who presented with ischemia of the left lower extremity and underwent thromboembolectomy as well as four-compartment fasciotomy. Medical team was asked for medical consult in regards to atrial fibrillation.   1.  Ischemic left leg. We will defer further care to vascular surgery and the primary service. Obviously, there are multiple concerns with usage of heparin given recent subdural hematoma, as well as subarachnoid hemorrhage. However, the risks and benefits have already been discussed at  length with the family by the vascular surgery service. We will continue for now to monitor for signs of neurological changes.  2.  Atrial fibrillation with rapid ventricular response, heart rate in the 140s currently with stable blood pressure. The patient is unable to take p.o. at this time. We will provide Cardizem 10 mg IV x 1 as well as start on Cardizem drip at 5 mg per hour with goal heart rate being less than 120; if required, can re-dose Cardizem IV bolus and then increase drip by 5 mg per hour at a time.  3.  Type 2 diabetes, non-insulin-requiring, uncomplicated. Provide insulin sliding scale with q. 6-hour Accu-Chek coverage, goal blood glucose between 120 and 180 in critical care setting.  4.  Hypokalemia. Replace potassium, goal 4 to 5.  5.  Elevated troponin. Trend cardiac enzymes x 3. Continue with telemetry.  6.  Venous thromboembolism prophylaxis, currently on heparin drip.   CODE STATUS:  The patient is a full code.   CRITICAL CARE TIME SPENT:  55 minutes.   ____________________________ Cletis Athens. Hower, MD dkh:nb D: 12/19/2013 00:08:11 ET T: 12/19/2013 01:22:57 ET JOB#: [id number]  cc: Cletis Athens. Hower, MD, <Dictator> DAVID Synetta Shadow MD ELECTRONICALLY SIGNED 12/19/2013 21:46

## 2014-06-17 NOTE — Consult Note (Signed)
PATIENT NAME:  Jenna Austin, Jenna Austin MR#:  [id number] DATE OF BIRTH:  1934-11-06  DATE OF CONSULTATION:  07/07/2013  CARDIOLOGY CONSULTATION  REFERRING PHYSICIAN:  Dr. Amado Coe with [facility] CONSULTING PHYSICIAN:  Dwayne D. Callwood, MD  PRIMARY CARE PHYSICIAN:  Hawkins  INDICATION: Rapid atrial fibrillation.   HISTORY OF PRESENT ILLNESS: The patient is a 80 year old white female with a history of diabetes, hypertension, chronic afib and dementia who was brought to the Emergency Room after a fall. The patient was ambulating with a cane at home. At around 10:30 while the patient was ambulating with help of the cane, she became unsteady, turned to the left side and eventually fell. The patient has a chronic history of dementia, so does not remember the fall. According to the husband, he saw her fall but did not hit her head. No loss of consciousness. Brought to the Emergency Room, was found to have a femoral neck fracture. Orthopedic was consulted and the patient was found to be in rapid atrial fibrillation, was treated with Cardizem and surgery was delayed. She has had a prolonged INR because of chronic Coumadin therapy because of her afib.  There is no evidence of overt bleeding, so cardiology was recommended for rhythm and rate evaluation.  PAST MEDICAL HISTORY: Hypertension, diabetes, chronic afib, dementia, history of CVA.   PAST SURGICAL HISTORY: Hysterectomy.   ALLERGIES: None.   SOCIAL HISTORY: Married, lives at home. No smoking or alcohol consumption.   FAMILY HISTORY: Hypertension.  MEDICATIONS: Coumadin 4 mg a day, metoprolol 25 a day, metformin 500 twice a day, atorvastatin 40 daily.   REVIEW OF SYSTEMS: The patient has dementia so it is difficult, but denies blackout spells, syncope. She has recently had a fall. No weight loss. No weight gain. No hemoptysis, hematemesis. No bright red blood per rectum. No chest pain. No significant weakness. Has reduced memory.   PHYSICAL  EXAMINATION: VITAL SIGNS: Blood pressure was 150/70, pulse of 120 and irregular, respiratory rate of 14, afebrile.  HEENT: Normocephalic, atraumatic. Pupils equal and reactive to light.  NECK: Supple. No significant JVD, bruits or adenopathy.  LUNGS: Clear to auscultation and percussion. No significant wheeze, rhonchi or rales.  HEART: Irregularly irregular, tachycardic. Systolic ejection murmur at the apex.  ABDOMEN: Benign. Positive bowel sounds. No rebound, guarding or tenderness.  EXTREMITIES: Pain in her right hip will reduce motion. Good pulses.  NEUROLOGIC: Difficult to assess but no obvious focal deficits.  SKIN: Normal.   LABORATORIES: Glucose was 185. BUN and creatinine were normal. Chemistries are normal. LFTs normal. White count was 19.4, hemoglobin 15.7, hematocrit 47.7, platelet count of 158. UA was essentially negative.  X-rays of her hip shows a transverse fracture at the left femoral neck.  Chest x-ray essentially unremarkable.   EKG: Atrial fibrillation, rapid ventricular response at 120, nonspecific ST and T wave changes.   ASSESSMENT: 1.  Rapid atrial fibrillation. 2.  Femoral neck fracture. 3.  Dementia.  4.  Hypertension.  5.  Diabetes. 6.  Deep vein thrombosis prophylaxis.   PLAN: 1.  Agree with admit. Place on short-term anticoagulation for deep venous thrombosis prophylaxis and anticoagulation. Will discontinue Coumadin in anticipation of hip surgery. Would continue rate control, probably with Cardizem. If that is not effective, we will consider switching to a beta blocker. I will hold aspirin until after surgery. I do not recommend cardioversion at this point. I am not in favor of an antiarrhythmic just rate control at this stage. 2.  For diabetes, continue  metformin therapy, maintain hydration. 3.  For hypertension, continue blood pressure control. She is on metoprolol. We will increase the dose as necessary. ACE inhibitor may be helpful as well. She may be  getting short doses of Cardizem to help with blood pressure, as well as rate control.  4.  For anticoagulation, again, she has been on Coumadin. We will switch that probably to heparin or Lovenox and discontinue Coumadin because of surgery and reinstitute later after surgery is completed on her femoral neck. 5.  Dementia appears to be .  Will defer therapy for dementia to her primary physician. 6.  Echocardiogram will be helpful in the face of atrial fibrillation. Will continue current therapy and treat the patient conservatively and try to get her ready for surgery. I will get beta blockers preoperative and postoperative.  ____________________________ Bobbie Stackwayne D. Juliann Paresallwood, MD ddc:ce D: 07/07/2013 16:29:46 ET T: 07/07/2013 17:25:20 ET JOB#: [id number]  cc: Dwayne D. Juliann Paresallwood, MD, <Dictator> Alwyn PeaWAYNE D CALLWOOD MD ELECTRONICALLY SIGNED 08/15/2013 0:55

## 2014-06-17 NOTE — Consult Note (Signed)
Brief Consult Note: Diagnosis: Left hip femoral neck fracture.   Patient was seen by consultant.   Recommend to proceed with surgery or procedure.   Comments: Met Jenna Austin and Jenna Austin this morning.  They met with Dr. Mauri Pole yesterday.  She is a 80 year old female who sustained a fracture to the left femoral neck after a fall Monday night.  Presented to the ED.   She has baseline dementia.  Suffered a CVA three years ago with residual left sided weakness. She does not use Jenna left arm but does ambulate independently at home with a can held in right hand.  Uses cane for short distances outside of home.  Uses wheelchair for longer distances.  PE: Left leg without abrasions, lesions. Moves foot to command and tactile stimulation. Sensation DPN, SPN, tib nerve intact. Foot is warm and perfused. Calf is soft and non tender.  Radiographs: displaced femoral neck fracture left hip  Plan: Spoke with Dr. Rudene Christians regardig this patient.  With younger age for this fracture type, dementia and weakness due to stroke, would likely benefit from an anterior total hip to increase longevity of procedure and decrease possibility of dislocation.    Jenna INR today is 1.5.  Needs to be 1.3 for anesthesia to perform a spinal.   Will allow patient to eat today.  NPO after midnight.  If INR is 1.3 tomorrow, plan for anterior total hip with Dr. Rudene Christians.  Electronic Signatures: Dawayne Patricia (MD)  (Signed 13-May-15 08:34)  Authored: Brief Consult Note   Last Updated: 13-May-15 08:34 by Dawayne Patricia (MD)

## 2014-06-17 NOTE — Consult Note (Signed)
PATIENT NAME:  Jenna Austin, MURRAY MR#:  [id number] DATE OF BIRTH:  [birth year]  DATE OF CONSULTATION:  07/05/2013  REFERRING PHYSICIAN:   CONSULTING PHYSICIAN:  Alysia Penna. Jaxzen Vanhorn, MD  REFERRING PHYSICIAN:  Dr. Margaretmary Eddy.   REASON FOR CONSULTATION:  A 80 year old female with a displaced left femoral neck fracture.   HISTORY OF PRESENT ILLNESS:  The patient fell on the day of admission. She was found on the floor. No other injuries. She was brought to the Emergency Room where she was found to have a displaced femoral neck fracture.   PAST MEDICAL HISTORY:  Chronic atrial fibrillation, hypertension, diabetes, dementia, CVA. She was to be admitted by the hospitalist service. She is on Coumadin with an INR at 2.4.   She has been a minimal ambulator prior to this injury. No significant hip pain prior to the injury.   PAST SURGICAL HISTORY:  Hysterectomy.   ALLERGIES:  No known drug allergies.   PSYCHOSOCIAL:  She lives at home with her husband. No smoking, alcohol or illicit drug use.   FAMILY HISTORY:  Notable for hypertension.   MEDICATIONS ON ADMISSION:  Coumadin 4 mg daily, metoprolol 25 mg daily, Metformin 500 mg p.o. b.i.d., atorvastatin 40 mg p.o. daily.   REVIEW OF SYSTEMS:   Unremarkable for any acute cardiac, respiratory, GI, GU, hematologic, neurologic, psychiatric or dermatologic problems. No eye or ENT problems.   PHYSICAL EXAMINATION:  GENERAL:  An elderly female. She is moderately sedated from medication.  VITAL SIGNS:  Blood pressure is 140/80, pulse is 120 and irregular. Respirations are 18.  HEENT:  PERRLA, EOMI. Atraumatic.   LUNGS:  Clear.  CHEST:  Nontender. Reveals irregularly irregular rate, no murmurs.  ABDOMEN:  Bowel sounds positive. Soft.  NEUROLOGIC EXAMINATION:  Alert, she is oriented x 2. She has a history of dementia. Neurovascular examination of the upper and lower extremities is intact.   ORTHOPAEDIC EXAMINATION:  Shoulders, elbows, wrists and hands overall have a  good range of motion. There is no tenderness about the upper extremities. The right lower extremity reveals some mild degenerative changes about the knee, otherwise unremarkable and nontender. The left lower extremity is shortened and externally rotated. There is pain in the groin with any attempted range of motion. Knee, foot and ankle are unremarkable.   LABORATORY WORK:  Blood sugar is 185. MET-B is otherwise unremarkable. Platelet count 158, hemoglobin 15.7, white blood cell count 19.4. Chest x-ray shows no active process. A 12-lead EKG shows atrial fibrillation. INR is 2.4.   IMPRESSION:  1.  Displaced left femoral neck fracture. Will ultimately need surgical intervention. Once cleared by Medicine and INR is corrected, we will proceed with surgery. Options, risks and benefits are discussed.  2.  Atrial fibrillation on Coumadin.  3.  Diabetes, stable.  4.  Hypertension, stable.  5.  A history of dementia.   PLAN:  As noted above.   ____________________________ Alysia Penna. Mauri Pole, MD jcc:jm D: 07/07/2013 11:40:47 ET T: 07/07/2013 11:57:41 ET JOB#: [id number]  cc: Alysia Penna. Mauri Pole, MD, <Dictator> Alysia Penna Krishon Adkison MD ELECTRONICALLY SIGNED 07/07/2013 14:50

## 2014-06-17 NOTE — Consult Note (Signed)
ORTHO Left displaced femoral neck fracture. Will need hemiarthroplasty. On coumadin with elvated INR.  Will proceed when INR 1.5 or less. Allow to eat today, check INR in AM  Electronic Signatures: Celesta Gentilealiff, Governor Matos C (MD)  (Signed on 12-May-15 10:41)  Authored  Last Updated: 12-May-15 10:41 by Celesta Gentilealiff, Karinna Beadles C (MD)

## 2014-06-17 NOTE — Discharge Summary (Signed)
PATIENT NAME:  Jenna Austin, Jenna Austin MR#:  [id number] DATE OF BIRTH:  13-Jan-1935  DATE OF ADMISSION:  07/05/2013 DATE OF DISCHARGE:  07/11/2013  DISCHARGE DIAGNOSES:  1. Left hip fracture status post surgery, postoperative day 4, doing well.  2. Atrial fibrillation with rapid ventricular rate, much better control with Cardizem and Betapace, on Coumadin for anticoagulation.   SECONDARY DIAGNOSES:  1. Hypertension.  2. Diabetes.  3. Chronic atrial fibrillation.  4. Dementia.  5. History of cerebrovascular accident.   CONSULTATIONS:  1. Orthopedics, Dr. Rosita KeaMenz.  2. Cardiology, Dr. Juliann Paresallwood.  3. Physical therapy and occupational therapy.   PROCEDURES AND RADIOLOGY: Left total hip arthroplasty by Dr. Rosita KeaMenz on the 14th of May.   Right knee x-ray on the 11th of May showed no acute fracture or subluxation.   Chest x-ray on the 12th of May showed no active disease.   Pelvic x-ray on the 12th of May showed transverse fracture of the left femoral neck with varus angulation.   Left femur x-ray on the 12th of May was showing transverse fracture of the left femoral neck with varus angulation. Distal femur was intact.   Hip x-ray status post surgery showed placement of left total hip prosthesis.   Left hip x-ray on the 14th of May showed findings as listed above.   Complete left hip x-ray on the 14th of May showed left hip prosthesis without acute complication.   Chest x-ray on the 16th of May showed no acute cardiopulmonary disease.   MAJOR LABORATORY PANEL: UA on admission was negative.   HISTORY AND SHORT HOSPITAL COURSE: The patient is a 80 year old female with the above-mentioned medical problems who was admitted for left femoral neck fracture. Please see Dr. Rob HickmanGouru's dictated history and physical for further details. Orthopedic consultation was obtained with Dr. Kennedy BuckerMichael Menz who recommended surgery and was performed on the 14th of May without any immediate complication. Cardiology, Dr. Juliann Paresallwood,  was following the patient concerning chronic atrial fibrillation and Coumadin was resumed post surgery. The patient was working with physical and occupational therapy, and rehab placement was recommended. On the 18th of May, the patient was feeling much better and was discharged to rehab place of her choice.   VITAL SIGNS: On the date of discharge, her vital signs were as follows: Temperature 97.6, heart rate 104 per minute, respirations 18 per minute, blood pressure 103/63 mmHg. She was saturating 93% on room air.   PERTINENT PHYSICAL EXAMINATION ON THE DATE OF DISCHARGE:  CARDIOVASCULAR: Irregularly irregular heart sounds. No murmurs, rubs or gallop.  LUNGS: Clear to auscultation bilaterally. No wheezing, rales, rhonchi or crepitation.  ABDOMEN: Soft, benign.  EXTREMITIES: Surgery site was clean with dressing intact. Leg lengths were equal.  NEUROLOGIC: Nonfocal examination.   All other physical examination remained at baseline.   DISCHARGE MEDICATIONS:  1. Metformin 500 mg p.o. b.i.d.  2. Atorvastatin 40 mg p.o. at bedtime.  3. Warfarin 4 mg p.o. at bedtime, to be held on the 18th of May and should be resumed on the 19th of May.  4. Oxycodone 5 mg p.o. every 4 hours as needed.  5. Digitek 0.25 mg p.o. daily.  6. Sotalol 80 mg p.o. daily.  7. Cardizem 60 mg p.o. every 6 hours.  8. Docusate/Senna 2 tablets p.o. at bedtime.   DIET: Low sodium.   DISCHARGE ACTIVITY: As tolerated and per orthopedic discharge instructions.   DISCHARGE INSTRUCTIONS AND FOLLOWUP: The patient was instructed to follow up with her primary care physician, Dr.  Kari Baars, in 1 to 2 weeks. She will need followup with orthopedics, Dr. Kennedy Bucker, in 2 to 4 weeks. The patient was recommended to continue physical therapy while at the facility.   TOTAL TIME DISCHARGING THIS PATIENT: 55 minutes.   ____________________________ Jenna Austin. Sherryll Burger, MD vss:gb D: 07/11/2013 23:48:03 ET T: 07/12/2013 00:12:56  ET JOB#: [id number]  cc: Jenna Austin S. Sherryll Burger, MD, <Dictator> Dr. Kari Baars Dwayne D. Juliann Pares, MD Leitha Schuller, MD Patricia Pesa MD ELECTRONICALLY SIGNED 07/19/2013 30:86

## 2014-06-17 NOTE — H&P (Signed)
PATIENT NAME:  Jenna Austin, Rida W MR#:  [id number] DATE OF BIRTH:  [birth year]  DATE OF ADMISSION:  07/05/2013  PRIMARY CARE PHYSICIAN: Kari BaarsEdward Hawkins, MD  REFERRING PHYSICIAN: Enedina Finnerandolph N. Manson PasseyBrown, MD  CHIEF COMPLAINT: Fall.   HISTORY OF PRESENT ILLNESS: The patient is a 80 year old Caucasian female with past medical history of diabetes mellitus, hypertension, chronic atrial fibrillation and dementia, who was brought into the ER after she sustained a fall. The patient usually ambulates with the help of a cane at home. Last night at around 11:30 p.m., while the patient was ambulating with the help of a cane, she became unsteady while turning around on the left side and sustained a fall. The patient has a chronic history of dementia, and she could not recall the event. According to the husband, who saw her falling, she did not hurt her head. No loss of consciousness. The patient was brought into the ER. In the ER, the patient was in atrial fibrillation with tachycardia at 130s. A hip x-ray has revealed a left femoral neck fracture. Orthopedics consult is placed, and hospitalist team is called to admit the patient. During my examination, pain is tolerable after given morphine. Husband is at bedside. No history of heart attacks in the past. As the patient is with atrial fibrillation with RVR, she has received Cardizem IV bolus x1. Orthopedics consult is placed to on call doctor, and as per my discussion with him, he is not planning to do any surgery today, with the patient's INR being at 2.4 as the patient is on Coumadin. The patient denies any chest pain or shortness of breath. No headaches, blurry vision. No other complaints. Denies any dizziness or loss of consciousness.   PAST MEDICAL HISTORY:  1. Hypertension.  2. Diabetes mellitus.  3. Chronic history of atrial fibrillation.  4. Dementia.  5. CVA.  PAST SURGICAL HISTORY: Hysterectomy.   ALLERGIES: No known drug allergies.   PSYCHOSOCIAL HISTORY:  Lives at home with husband. No history of smoking, alcohol or illicit drug usage.   FAMILY HISTORY: Hypertension runs in her family.   HOME MEDICATIONS:  1. Coumadin 4 mg p.o. once a day.  2. Metoprolol 25 mg p.o. once a day.  3. Metformin 500 mg p.o. b.i.d. 4. Atorvastatin 40 mg p.o. once a day.    REVIEW OF SYSTEMS:  CONSTITUTIONAL: Denies any fever, fatigue. Denies any headache or dizziness. EYES: Denies blurry vision, double vision, glaucoma.  ENT: Denies epistaxis, dysphagia or slurred speech.  CARDIOVASCULAR: No chest pain, palpitations, dizziness or syncope. Denies any leg edema. PULMONARY: Denies any cough, hemoptysis, shortness of breath.  GASTROINTESTINAL: Denies nausea, vomiting, diarrhea, hematemesis or melena.  GENITOURINARY: No dysuria, hematuria. SKIN: No rashes. No jaundice.  HEMATOLOGIC AND LYMPHATIC: No anemia, easy bruising, bleeding. INTEGUMENTARY: No acne, rash, lesions. No jaundice.  NEUROLOGIC: No vertigo, no ataxia. Has a past medical history of stroke. No seizures.  MUSCULOSKELETAL: Left hip is in pain. Denies any shoulder pain, neck pain. Denies any gout.  ENDOCRINE: Denies polyuria, nocturia or thyroid problems. No heat or cold intolerance.   PHYSICAL EXAMINATION:  VITAL SIGNS: Temperature 97.6, pulse 121, respirations 18, blood pressure 155/74, pulse oximetry 94% on room air.  GENERAL APPEARANCE: Not under acute distress. Moderately built and moderately nourished.  HEENT: Normocephalic, atraumatic. Pupils are equally reacting to light and accommodation. No scleral icterus. No conjunctival injection. No sinus tenderness. No postnasal drip. Moist mucous membranes.  NECK: Supple. No JVD. No thyromegaly. Range of motion is intact.  LUNGS: Clear to auscultation bilaterally. No accessory muscle usage. No anterior chest wall tenderness on palpation.  CARDIOVASCULAR: Irregularly irregular. No murmurs.  GASTROINTESTINAL: Soft. Bowel sounds are positive in all 4  quadrants. Nontender, nondistended. No hepatosplenomegaly. No masses felt.  NEUROLOGIC: Awake, alert and oriented x2 to 3. She has chronic history of dementia and forgetful sometimes. Motor and sensory are grossly intact except in the left hip area, which is externally rotated in view of left femoral neck fracture. It is tender to touch. Peripheral pulses are 2+. No peripheral edema.  EXTREMITIES: No edema. No cyanosis. No clubbing.  SKIN: Warm to touch. Normal turgor. No rashes. No lesions.  PSYCHIATRIC: Flat mood and affect.   LABORATORIES AND IMAGING STUDIES: Accu-Chek 185. BMP: Glucose 194, BUN and creatinine are normal, sodium and potassium are normal, chloride is 109. The rest of the BMP is normal. LFTs: Total bilirubin is 1.2. The rest of the LFTs are normal. CBC: WBC 19.4, hemoglobin 15.7, hematocrit 47.7, platelets 158. PT 24.3, INR 2.4. Urinalysis: Yellow in color, clear in appearance, nitrites and leukocyte esterase are negative, bacteria trace. Left femur x-ray: Transverse fracture of the left femoral neck with varus angulation. Distal femur is intact. Pelvic x-ray has revealed transverse fracture of the left femoral neck with varus angulation. Chest x-ray, PA and lateral views: No active disease. A 12-lead EKG: Atrial fibrillation with RVR at 122 beats per minute. No acute ST-T wave changes.   ASSESSMENT AND PLAN: A 80 year old female who has chronic history of dementia and ambulates slowly with the help of a cane, got tripped over while turning over with the help of a cane last night at around 11:30 p.m. and sustained a left femoral neck fracture.   1. Will admit her to orthopedic floor. Orthopedic consult is placed . Medically not cleared for surgery as the patient's INR is therapeutic at 2.4. Will repeat PT, INR tomorrow. Pain management per orthopedics. Currently, the patient is on morphine as-needed basis.  2. Chronic history of atrial fibrillation with rapid ventricular response. INR is  therapeutic. Will hold Coumadin as orthopedics is planning to do surgery tomorrow. We might have to consider FFP. Continue home medication, Cardizem.  3. Diabetes mellitus. Continue metformin. The patient will be on sliding scale insulin.  4. Hypertension. Blood pressure is stable. Continue on metoprolol and up-titrate as-needed basis. 5. Chronic history of dementia, as reported by the patient's husband.  6. Gastrointestinal prophylaxis with ranitidine.  7. Deep vein thrombosis prophylaxis is not needed as the INR is therapeutic at 2.4.   CODE STATUS: She is full code. Husband is the medical power of attorney.  Diagnosis and plan of care were discussed in detail with the patient and her husband at bedside. They both verbalized understanding of the plan.   I have discussed with orthopedics doctor, who is aware of the patient's INR 2.4 and he is not planning to do surgery today. The patient is not medically cleared. Orthopedics might consider doing surgery tomorrow if INR is therapeutic or close to 1.5.   TOTAL TIME SPENT ON ADMISSION: 50 minutes.   ____________________________ Ramonita Lab, MD ag:lb D: 07/05/2013 07:57:52 ET T: 07/05/2013 08:24:02 ET JOB#: [id number]  cc: Ramonita Lab, MD, <Dictator> Kari Baars, MD Ramonita Lab MD ELECTRONICALLY SIGNED 07/19/2013 1:48

## 2014-06-17 NOTE — H&P (Signed)
Subjective/Chief Complaint ischemic left leg   History of Present Illness The patient is a 80 yo demented woman with a history of A-Fib who is a resident of [facility].  She is brought to the ER with marked cyanosis and coldness of teh left foot.  Changes were noted in the ankle area this am and have progressed throughout the day now extending to the knee.  The daughter states the foot was normal yesterday.  On November 25, 2013 she fell sustaining a intracrainial hemorrhage and was taken off Coumadin.   Past History PAST MEDICAL HISTORY:  1. Hypertension.  2. Diabetes mellitus.  3. Chronic history of atrial fibrillation.  4. Dementia.  5. CVA. 6.  Left partial hemiparesis 7  Intracranial hemorrhage while on Coumadin secondary to a fall 11/25/2013  PAST SURGICAL HISTORY: Hysterectomy.   Past Med/Surgical Hx:  dementia:   UTI:   Atrial Fibrillation:   CVA/Stroke:   Diabetes - Borderline:   HTN:   cataract surgery- bilateral eyes:   hysterectomy:   ALLERGIES:  No Known Allergies:   HOME MEDICATIONS: Medication Instructions Status  sotalol 80 milligram(s) orally once a day Active  Cardizem 60 mg oral tablet 1 tab(s) orally every 6 hours Active   Family and Social History:  Family History Non-Contributory   Social History negative tobacco, negative ETOH, negative Illicit drugs   Place of Living [place]   Review of Systems:  ROS Pt not able to provide ROS  Dementia   Physical Exam:  GEN well developed, obese   HEENT hearing intact to voice, dry oral mucosa, poor dentition   NECK supple  trachea midline   RESP normal resp effort  no use of accessory muscles   CARD irregular rate  no JVD   ABD denies tenderness  nondistended   EXTR positive edema, Rt femoral pulse 2+ left is nonpalpable;  right leg is pink and warm but the popliteal and pedal pulses are nonpalpable;  left leg is profoundly cyanotic and cold, no motor (but has had a CVA)    SKIN positive rashes, skin turgor poor   NEURO L side weakness, does not follow commands   PSYCH poor insight, not oriented   Lab Results: Hepatic:  25-Oct-15 15:45   Albumin, Serum  2.8  Routine Chem:  25-Oct-15 15:45   Glucose, Serum  400  BUN 13  Creatinine (comp) 0.92  Sodium, Serum 144  Potassium, Serum  3.4  Chloride, Serum  109  CO2, Serum 25  Calcium (Total), Serum  8.3  Osmolality (calc) 304  eGFR (African American) >60  eGFR (Non-African American) >60 (eGFR values <88m/min/1.73 m2 may be an indication of chronic kidney disease (CKD). Calculated eGFR, using the MRDR Study equation, is useful in  patients with stable renal function. The eGFR calculation will not be reliable in acutely ill patients when serum creatinine is changing rapidly. It is not useful in patients on dialysis. The eGFR calculation may not be applicable to patients at the low and high extremes of body sizes, pregnant women, and vetetarians.)  Anion Gap 10  Routine Coag:  25-Oct-15 15:45   Prothrombin 14.5  INR 1.1 (INR reference interval applies to patients on anticoagulant therapy. A single INR therapeutic range for coumarins is not optimal for all indications; however, the suggested range for most indications is 2.0 - 3.0. Exceptions to the INR Reference Range may include: Prosthetic heart valves, acute myocardial infarction, prevention of myocardial infarction, and  combinations of aspirin and anticoagulant. The need for a higher or lower target INR must be assessed individually. Reference: The Pharmacology and Management of the Vitamin K  antagonists: the seventh ACCP Conference on Antithrombotic and Thrombolytic Therapy. WEXHB.7169 Sept:126 (3suppl): [id number]. A HCT value >55% may artifactually increase the PT.  In one study,  the increase was an average of 25%. Reference:  "Effect on Routine and Special Coagulation Testing Values of Citrate Anticoagulant Adjustment in Patients  with High HCT Values." American Journal of Clinical Pathology 2006;126:400-405.)  Routine Hem:  25-Oct-15 15:45   WBC (CBC)  16.6  RBC (CBC) 4.46  Hemoglobin (CBC) 12.7  Hematocrit (CBC) 40.0  Platelet Count (CBC) 161 (Result(s) reported on 18 Dec 2013 at 04:10PM.)  MCV 90  MCH 28.4  MCHC  31.6  RDW  15.5    Assessment/Admission Diagnosis 1.  Ischemic left leg secondary to chronic PAD with Acute embolism secondary to A-Fib         the process has been going on for >8 hours and I have discussed witht eh daughter the very poor prognosis for limb salvage.         lenght of discussion with family was 30 minutes in addition to the H&P         I will plan to do a femoral cutdown on the the left and perform thromboembolectomy, she will require fasciotomies as well         We will see how her perfusion improves from that time point 2. Chronic history of atrial fibrillation with rapid ventricular response. INR is normal and she is off Coumadin but given the recent intracrainial bleed she can't be resarter on chronic anticoagulation.  I have discussed with the daughter that she will need heparin in the short term and that represents a significant risk.  rate is controlled for now and Cardizem will be continued. 3. Diabetes mellitus. The patient will be on sliding scale insulin.  4. Hypertension. Blood pressure is stable. Continue on metoprolol and up-titrate as-needed basis. 5. Chronic history of dementia, as reported by the patient's daughter and substantiated by her PE.  6. Gastrointestinal prophylaxis with ranitidine.   Plan Level 3 Admit   Electronic Signatures: Hortencia Pilar (MD)  (Signed 25-Oct-15 16:26)  Authored: CHIEF COMPLAINT and HISTORY, PAST MEDICAL/SURGIAL HISTORY, ALLERGIES, HOME MEDICATIONS, FAMILY AND SOCIAL HISTORY, REVIEW OF SYSTEMS, PHYSICAL EXAM, LABS, ASSESSMENT AND PLAN   Last Updated: 25-Oct-15 16:26 by Hortencia Pilar (MD)

## 2014-06-17 NOTE — Op Note (Signed)
PATIENT NAME:  Jenna Austin, Jenna Austin MR#:  [id number] DATE OF BIRTH:  1934/05/02  DATE OF PROCEDURE:  12/18/2013  PREOPERATIVE DIAGNOSES:  1.  Ischemic left lower extremity.  2.  Atrial fibrillation with embolization to left lower extremity.  3.  Peripheral arterial disease.  POSTOPERATIVE DIAGNOSES: 1.  Ischemic left lower extremity.  2.  Atrial fibrillation with embolization to left lower extremity.  3.  Peripheral arterial disease.  PROCEDURES PERFORMED: 1.  Thromboembolectomy, left common and external iliac artery, via femoral approach.  2.  Embolectomy of the superficial femoral artery and deep femoral artery, left lower extremity, from femoral artery approach.  3.  Four-compartment fasciotomy, left lower extremity.   SURGEON: Renford DillsGregory G. Glover Capano, MD   ANESTHESIA: General by endotracheal intubation.   FLUIDS: Per anesthesia record.   ESTIMATED BLOOD LOSS: 50 mL  SPECIMEN: Thrombus from arterial system to pathology for permanent section.   INDICATIONS: Jenna Austin is a 80 year old woman who [facility] with many hours of ischemia of the lower extremity. She is known to be in atrial fibrillation. She recently, that is, on October 2, fell, sustaining a fracture of her left arm in association with a subdural hematoma. For this reason, she was taken off her anticoagulation. She is now embolized secondary to her atrial fibrillation and has an ischemic leg. Risks and benefits as well as alternative therapies were reviewed with the daughter. All questions were answered. Daughter wishes for us to proceed in an attempt to save the left lower extremity. I have also discussed the problems of vascular surgeries, operations, and the need for heparin anticoagulation, acknowledging that she is just several weeks out from her intracranial hemorrhage. The daughter voices understanding of this and concurs and wishes for us to proceed as well.   DESCRIPTION OF PROCEDURE:  The patient is taken to the operating room and placed in the supine position. After adequate general anesthesia is induced and appropriate invasive monitors are placed, she is positioned supine and her left leg is prepped and draped in a circumferential fashion. Appropriate timeout is called.   A vertical incision is made in the left groin and the dissection is carried down to expose the ilioinguinal ligament. The femoral sheath is then incised and the common femoral artery is identified. Dissection is then carried circumferentially around the common femoral origin of the SFA, as well as the origin of the profunda femoris, and Silastic vessel loops are passed around all 3 structures.   Heparin 3000 units is given and allowed to circulate for several minutes. Transverse arteriotomy is then made with an 11 blade and a #4 Fogarty is passed approximately 5 times through the iliac system with return of pulsatile blood flow. The retrieved specimen is placed in a cup for pathology examination. The iliac system is then irrigated with 60 mL of heparinized saline and re-clamped.   Attention is then turned to first the SFA, and a #3 Fogarty is advanced to 65 cm and slowly withdrawn. Again, 6 or 7 passes are made before there is no retrieval of thrombotic material. The retrieved specimen is placed in the same cup, and after approximately 30 to 60 seconds there is backbleeding noted. Heparinized saline is then irrigated, approximately 40 mL, and then the SFA is re-clamped, and in a similar fashion 3 passes are made in the profunda femoris with the 3 Fogarty and then it is irrigated with heparinized saline.   The transverse arteriotomy is then closed  with a total of 6 interrupted 6-0 Prolene sutures and flow is established, first to the profunda then the SFA. Within a few minutes, the mottling of the thigh has receded significantly.   Attention is then turned to the calf, where first a medial incision is made, exposing  the fascia which is then incised with a curved Mayo scissors from the top to the bottom. The posterior compartment is also opened.   Attention is then turned to the lateral aspect, and by palpation the line between the anterior and the lateral compartments is identified, incision is made, and then using the curved Mayos, both the anterior fascia is opened as well as the lateral. Bovie cautery is then used to obtain hemostasis on both incisions, and a VAC wound dressing with Adaptic and the black GranuFoam is then placed. Once adequate seal has been obtained, Dermabond is placed on the left groin incision. It should be noted that prior to beginning the fasciotomies, the groin wound was irrigated with sterile saline. SurgiSeal was placed around the suture line and, subsequently, the groin incision was closed in 6 layers using 2 layers of 2-0 Vicryl followed by 3 layers of 3-0 Vicryl followed by 4-0 Monocryl subcuticular and then, as noted, the Dermabond. The patient tolerated the procedure well. There were no immediate complications. Sponge and needle counts were correct, and she was taken to the recovery room in stable but guarded condition.   ____________________________ Renford Dills, MD ggs:ST D: 12/18/2013 21:15:49 ET T: 12/19/2013 00:50:56 ET JOB#: [id number]  cc: Renford Dills, MD, <Dictator> Oneal Deputy. Juanetta Gosling, MD Renford Dills MD ELECTRONICALLY SIGNED 12/28/2013 11:46

## 2014-06-19 LAB — SURGICAL PATHOLOGY

## 2015-11-29 IMAGING — CT CT HEAD WITHOUT CONTRAST
1 series · 16 of 30 positions shown, 20 images · non-contrast
Comparison: Head MRI 08/21/2008

CLINICAL DATA: Transient dizziness and nausea. History of stroke 1
year ago.

EXAM:
CT HEAD WITHOUT CONTRAST
TECHNIQUE: Contiguous axial images were obtained from the base of the skull
through the vertex without intravenous contrast.

[Series 2: soft tissue · axial · 0.42mm/px · z∈[-89,+46]mm · 16 of 30 slices shown, 20 images]
[im 2/30  brain]
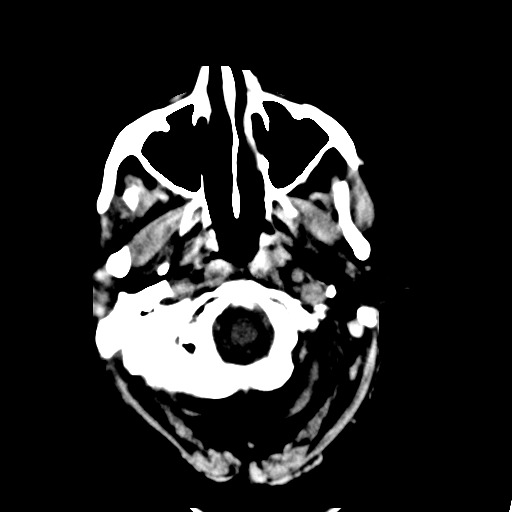
[im 2/30  bone]
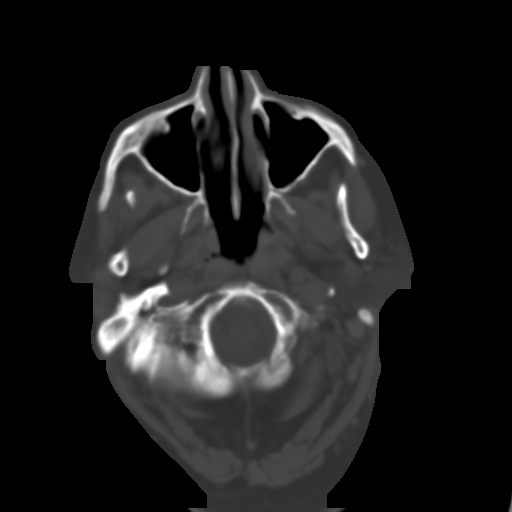
[im 4/30  brain]
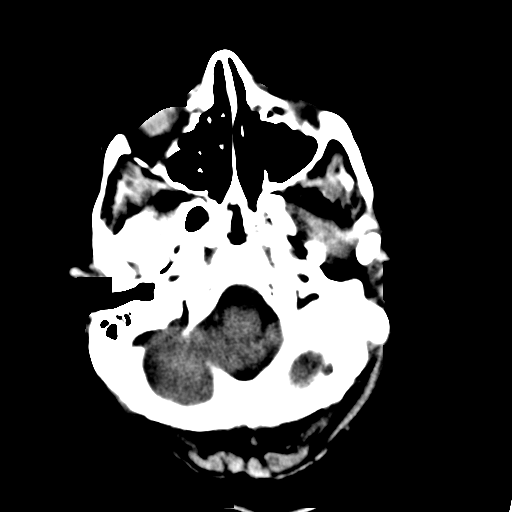
[im 6/30  brain]
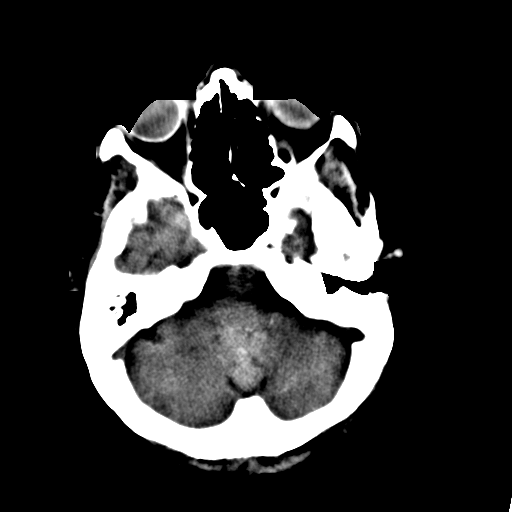
[im 8/30  brain]
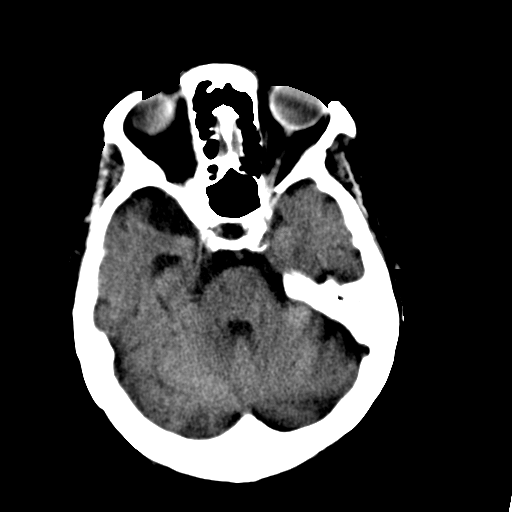
[im 9/30  brain]
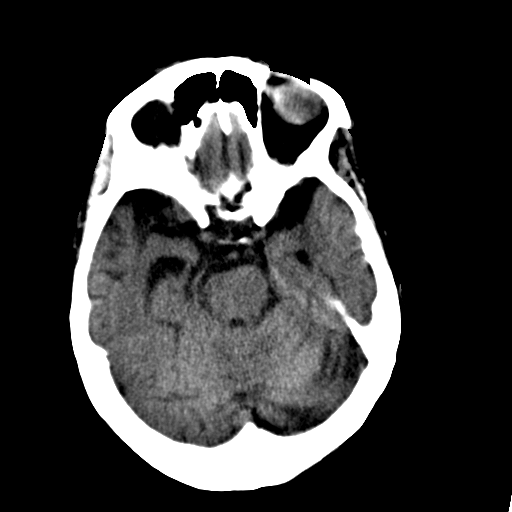
[im 9/30  bone]
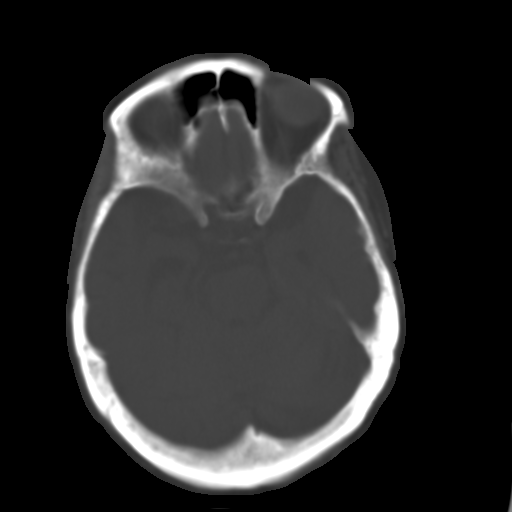
[im 11/30  brain]
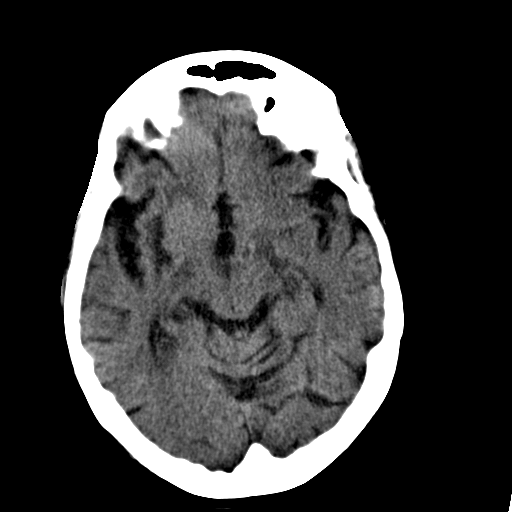
[im 13/30  brain]
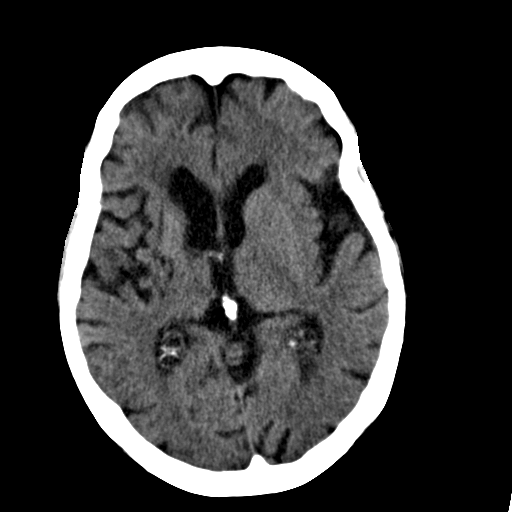
[im 15/30  brain]
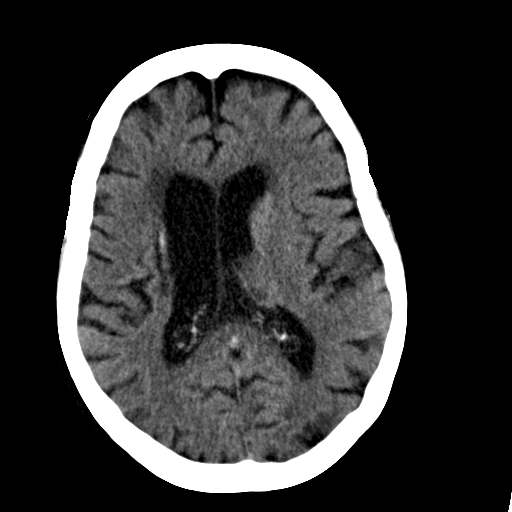
[im 16/30  brain]
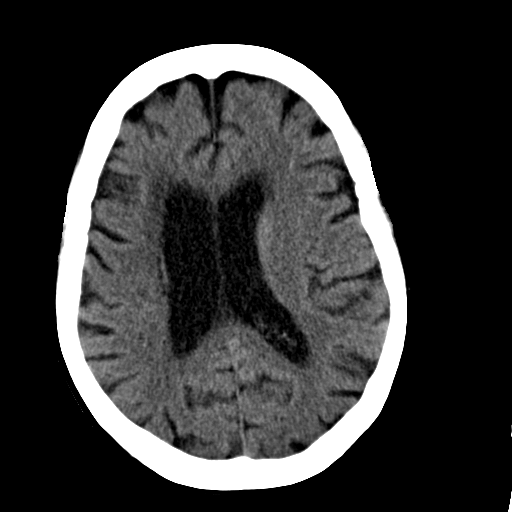
[im 16/30  bone]
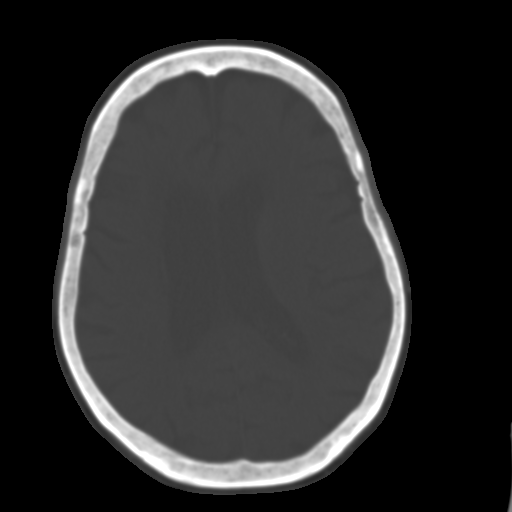
[im 18/30  brain]
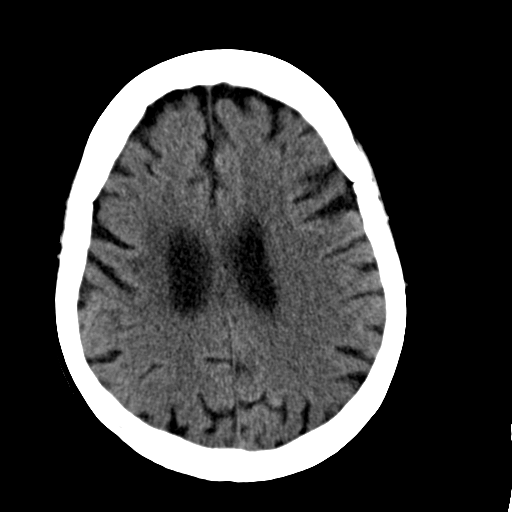
[im 20/30  brain]
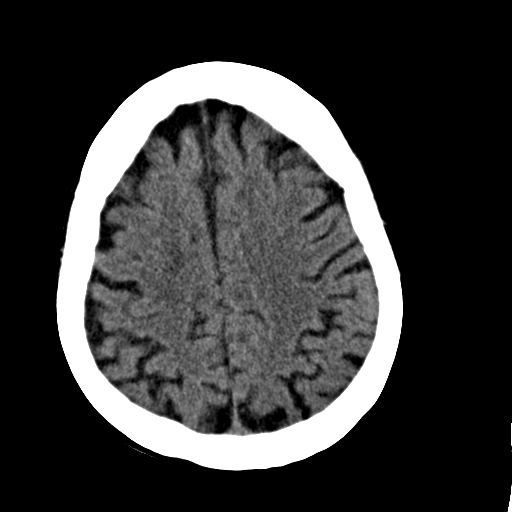
[im 22/30  brain]
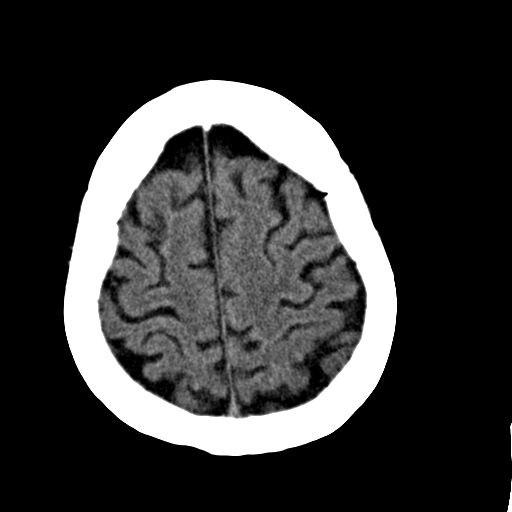
[im 23/30  brain]
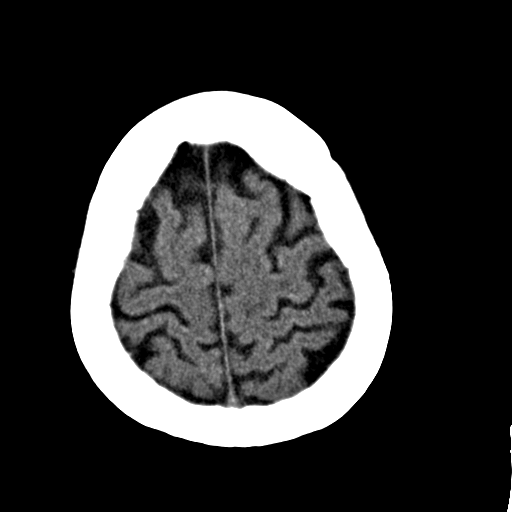
[im 23/30  bone]
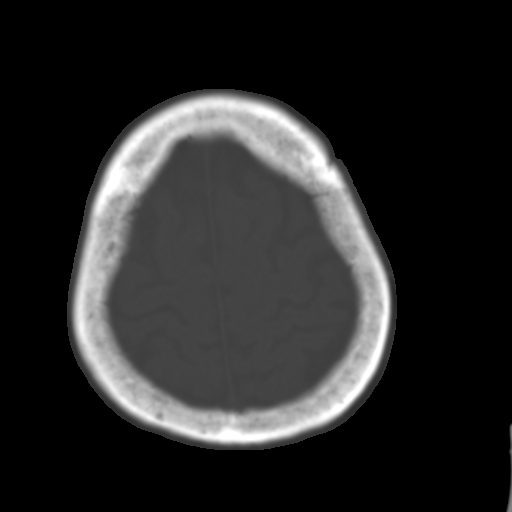
[im 25/30  brain]
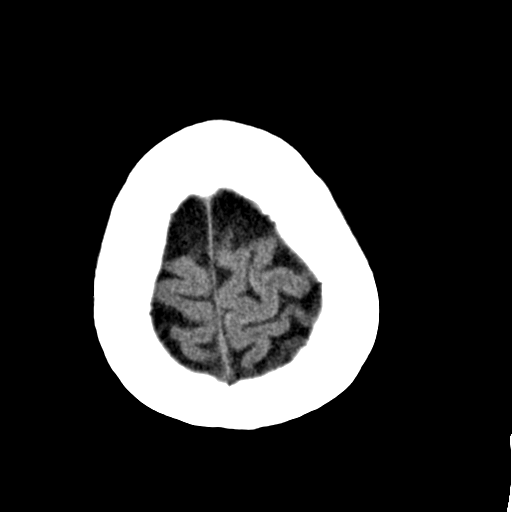
[im 27/30  brain]
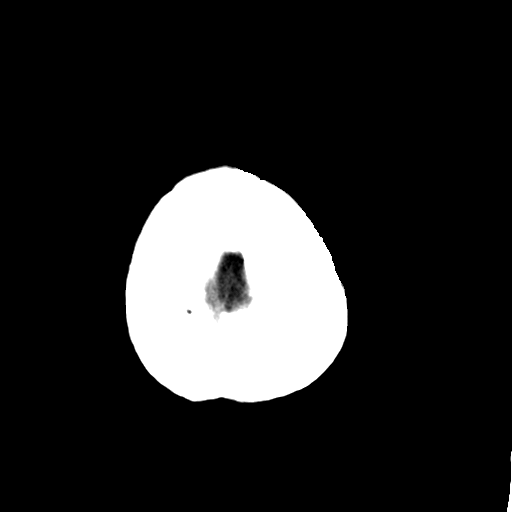
[im 29/30  brain]
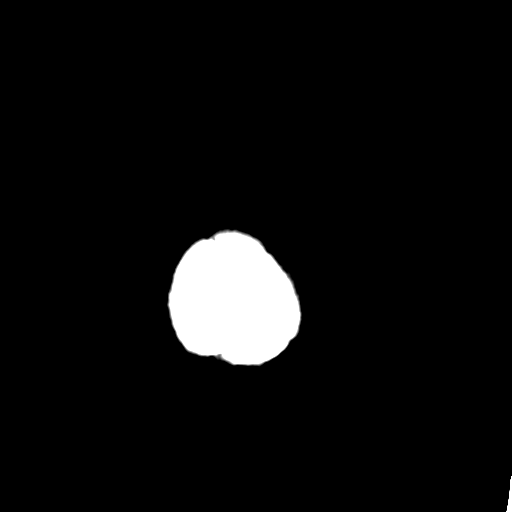

[16 of 30 positions shown; findings below may reference images not displayed]

FINDINGS: There is new hyperdensity in the region the right external capsule,
consistent with interval, chronic infarct. There is associated ex
vacuo dilatation of the right lateral ventricle. Patchy
periventricular white matter hypodensities are compatible with mild
chronic small vessel ischemic disease. There is no evidence of acute
cortical infarct, mass, midline shift, extra-axial fluid collection,
or intracranial hemorrhage. Prior bilateral cataract surgery is
noted. There is trace left mastoid fluid. Visualized paranasal
sinuses are clear.
IMPRESSION: 1. No evidence of acute intracranial abnormality.
2. Interval, chronic right external capsule infarct.

## 2016-04-06 IMAGING — CR DG ABDOMEN 1V
1 series · 2 of 2 positions shown · non-contrast
Comparison: 10/17/2013

CLINICAL DATA: Constipation.

EXAM:
ABDOMEN - 1 VIEW

[Series 1: dxr kidney ureter bladder · 0.14mm/px · 2 of 2 slices shown]
[im 1/2]
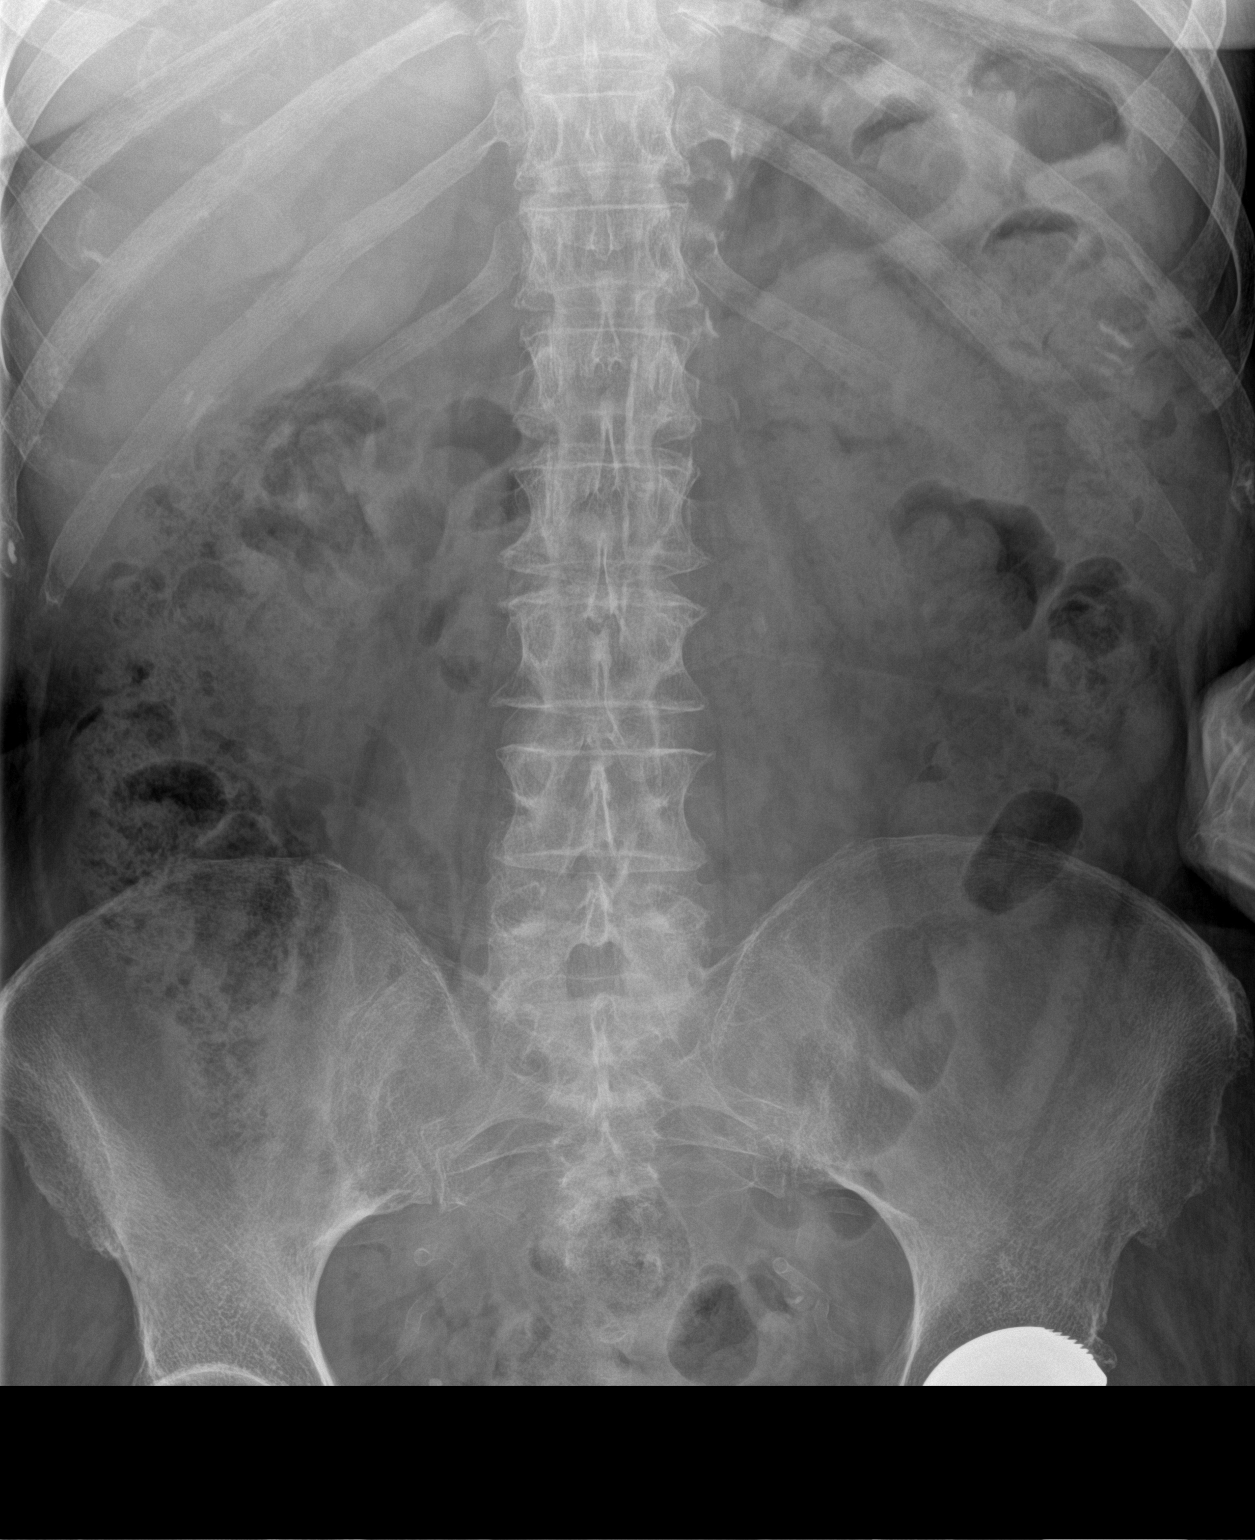
[im 2/2]
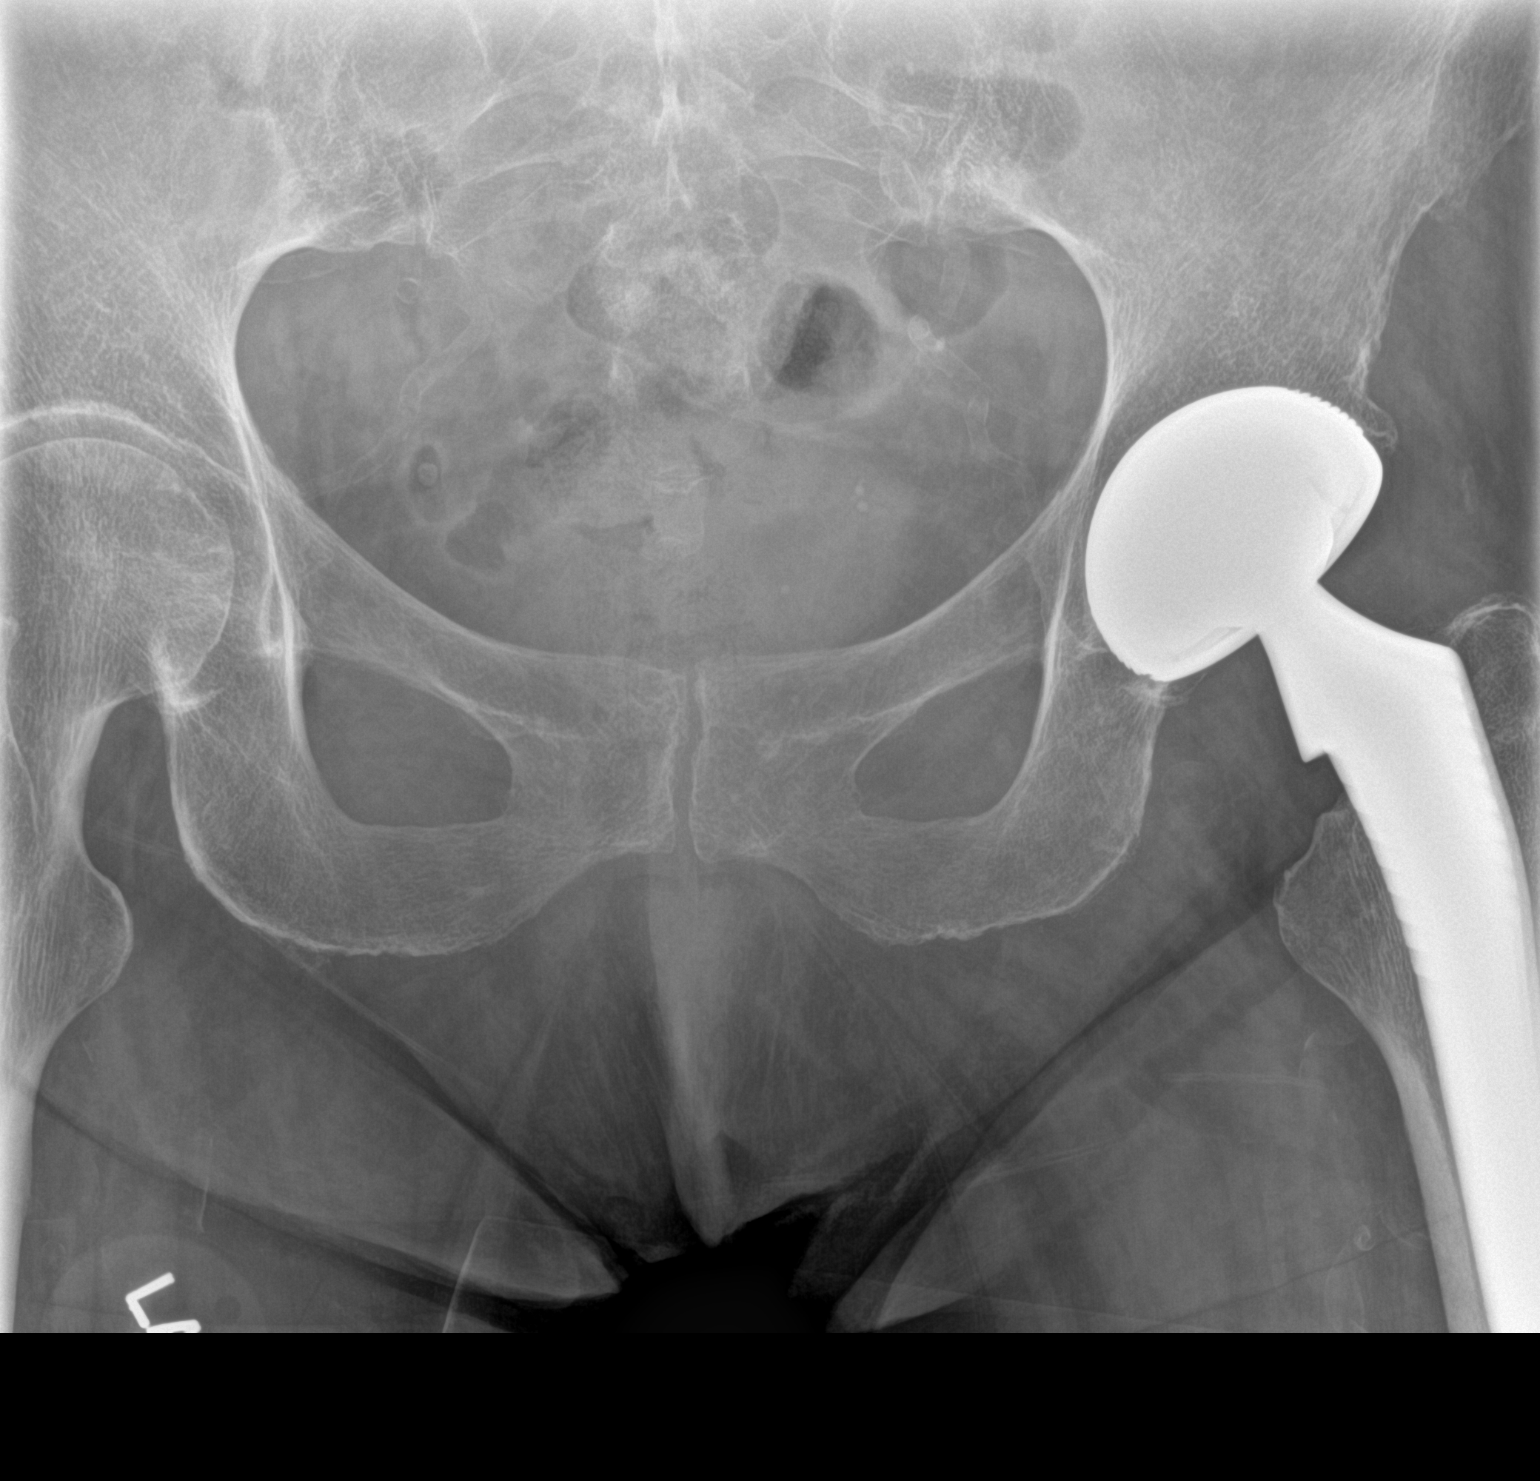

[2 of 2 positions shown; findings below may reference images not displayed]

FINDINGS: Formed stool present in most colonic segments. No small bowel
obstruction. Stable pelvic calcifications consistent with
phleboliths. Pelvic atherosclerosis. Unremarkable total left hip
arthroplasty.
IMPRESSION: Constipation.  No small bowel obstruction.

## 2016-05-06 IMAGING — CR DG HUMERUS 2V *L*
1 series · 1 of 1 positions shown · non-contrast
Comparison: None.

CLINICAL DATA: Patient status post fall. Pain in the upper anterior
left arm.

EXAM:
LEFT HUMERUS - 2+ VIEW

[dxr humerus left]
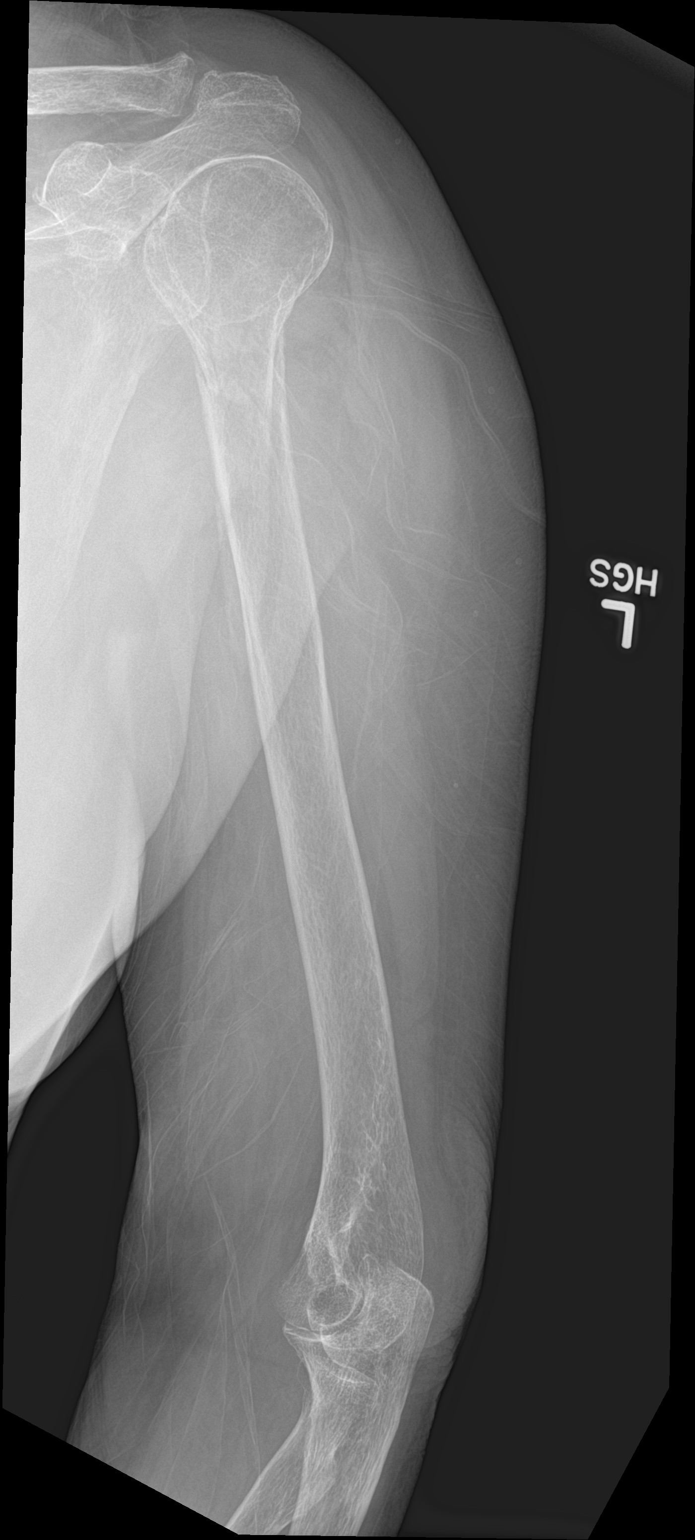

[1 of 1 positions shown; findings below may reference images not displayed]

FINDINGS: Only a single oblique view of the humerus was able to be obtained.
Within the proximal aspect of the left humerus there are multiple
areas of cortical irregularity. The regional soft tissues are
unremarkable.
IMPRESSION: Only a single radiograph of the left humerus was submitted.

There is suggestion of cortical irregularity within the proximal
aspect of the the left humerus which is concerning for proximal
humerus fracture. Recommend correlation with additional images when
patient able.
